# Patient Record
Sex: Male | Born: 2000
Health system: Southern US, Community
[De-identification: ages and names within clinical notes are randomized; demographics above are authoritative.]

## PROBLEM LIST (undated history)

## (undated) DIAGNOSIS — H9190 Unspecified hearing loss, unspecified ear: Secondary | ICD-10-CM

## (undated) DIAGNOSIS — F819 Developmental disorder of scholastic skills, unspecified: Secondary | ICD-10-CM

## (undated) DIAGNOSIS — M419 Scoliosis, unspecified: Secondary | ICD-10-CM

## (undated) DIAGNOSIS — M539 Dorsopathy, unspecified: Secondary | ICD-10-CM

## (undated) DIAGNOSIS — Q740 Other congenital malformations of upper limb(s), including shoulder girdle: Secondary | ICD-10-CM

## (undated) DIAGNOSIS — G4730 Sleep apnea, unspecified: Secondary | ICD-10-CM

## (undated) DIAGNOSIS — J189 Pneumonia, unspecified organism: Secondary | ICD-10-CM

## (undated) HISTORY — PX: TYMPANOSTOMY TUBE PLACEMENT: SHX32

## (undated) HISTORY — PX: DENTAL SURGERY: SHX609

## (undated) HISTORY — PX: HERNIA REPAIR: SHX51

## (undated) HISTORY — PX: CIRCUMCISION: SUR203

## (undated) HISTORY — PX: TONSILLECTOMY AND ADENOIDECTOMY: SUR1326

## (undated) HISTORY — PX: BACK SURGERY: SHX140

---
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
## 2021-01-09 DIAGNOSIS — Z981 Arthrodesis status: Secondary | ICD-10-CM | POA: Diagnosis not present

## 2021-01-09 DIAGNOSIS — M419 Scoliosis, unspecified: Secondary | ICD-10-CM | POA: Diagnosis not present

## 2021-01-15 DIAGNOSIS — Z23 Encounter for immunization: Secondary | ICD-10-CM | POA: Insufficient documentation

## 2021-01-15 DIAGNOSIS — Z7689 Persons encountering health services in other specified circumstances: Secondary | ICD-10-CM | POA: Insufficient documentation

## 2021-01-16 ENCOUNTER — Other Ambulatory Visit: Payer: Self-pay

## 2021-01-16 ENCOUNTER — Ambulatory Visit (INDEPENDENT_AMBULATORY_CARE_PROVIDER_SITE_OTHER): Payer: BC Managed Care – PPO | Admitting: Nurse Practitioner

## 2021-01-16 ENCOUNTER — Encounter (HOSPITAL_BASED_OUTPATIENT_CLINIC_OR_DEPARTMENT_OTHER): Payer: Self-pay | Admitting: Nurse Practitioner

## 2021-01-16 VITALS — BP 101/77 | HR 68 | Ht <= 58 in | Wt 97.4 lb

## 2021-01-16 DIAGNOSIS — R6252 Short stature (child): Secondary | ICD-10-CM | POA: Insufficient documentation

## 2021-01-16 DIAGNOSIS — Q740 Other congenital malformations of upper limb(s), including shoulder girdle: Secondary | ICD-10-CM

## 2021-01-16 DIAGNOSIS — Q763 Congenital scoliosis due to congenital bony malformation: Secondary | ICD-10-CM

## 2021-01-16 DIAGNOSIS — H9190 Unspecified hearing loss, unspecified ear: Secondary | ICD-10-CM | POA: Insufficient documentation

## 2021-01-16 DIAGNOSIS — N3944 Nocturnal enuresis: Secondary | ICD-10-CM | POA: Insufficient documentation

## 2021-01-16 DIAGNOSIS — G919 Hydrocephalus, unspecified: Secondary | ICD-10-CM | POA: Insufficient documentation

## 2021-01-16 DIAGNOSIS — Z23 Encounter for immunization: Secondary | ICD-10-CM

## 2021-01-16 DIAGNOSIS — F639 Impulse disorder, unspecified: Secondary | ICD-10-CM

## 2021-01-16 DIAGNOSIS — Z713 Dietary counseling and surveillance: Secondary | ICD-10-CM

## 2021-01-16 DIAGNOSIS — H903 Sensorineural hearing loss, bilateral: Secondary | ICD-10-CM | POA: Diagnosis not present

## 2021-01-16 DIAGNOSIS — Z7689 Persons encountering health services in other specified circumstances: Secondary | ICD-10-CM

## 2021-01-16 DIAGNOSIS — K029 Dental caries, unspecified: Secondary | ICD-10-CM | POA: Insufficient documentation

## 2021-01-16 DIAGNOSIS — IMO0002 Reserved for concepts with insufficient information to code with codable children: Secondary | ICD-10-CM | POA: Insufficient documentation

## 2021-01-16 DIAGNOSIS — F819 Developmental disorder of scholastic skills, unspecified: Secondary | ICD-10-CM

## 2021-01-16 DIAGNOSIS — R4689 Other symptoms and signs involving appearance and behavior: Secondary | ICD-10-CM | POA: Insufficient documentation

## 2021-01-16 DIAGNOSIS — R479 Unspecified speech disturbances: Secondary | ICD-10-CM | POA: Insufficient documentation

## 2021-01-16 HISTORY — DX: Short stature (child): R62.52

## 2021-01-16 HISTORY — DX: Unspecified hearing loss, unspecified ear: H91.90

## 2021-01-16 NOTE — Assessment & Plan Note (Signed)
Discussion on appropriate dietary measures including: No more than one cup of coffee per day- without refills No more than one cup of soda per day- without refills Avoid high sugar foods and replace with fruits and vegetables Importance of healthy food options for growth and healing.

## 2021-01-16 NOTE — Assessment & Plan Note (Signed)
Review of current and past medical history, social history, medication, and family history.  Review of care gaps and health maintenance recommendations.  Records from recent providers to be requested if not available in Chart Review or Care Everywhere.  Recommendations for health maintenance, diet, and exercise provided.   

## 2021-01-16 NOTE — Patient Instructions (Addendum)
Recommendations from today's visit: We will call you when the vaccine is in and you can come in at your convenience for a nurse visit to get this vaccine.   Information on diet, exercise, and health maintenance recommendations are listed below. This is information to help you be sure you are on track for optimal health and monitoring.   Please look over this and let us know if you have any questions or if you have completed any of the health maintenance outside of [facility] so that we can be sure your records are up to date.  ___________________________________________________________  Thank you for choosing [facility] for your Primary Care needs. I am excited for the opportunity to partner with you to meet your health care goals. It was a pleasure meeting you today!  I am an Adult-Geriatric Nurse Practitioner with a background in caring for patients for more than 20 years. I received my Paediatric nurse in Nursing and my Doctor of Nursing Practice degrees at [employer]. I received additional fellowship training in primary care and sports medicine after receiving my doctorate degree. I provide primary care and sports medicine services to patients age 28 and older within this office. I am also a provider with the [facility] and the director of the APP Fellowship with [facility].  I am a [place] native, but have called the [place] area home for nearly 20 years and am proud to be a member of this community.   I am passionate about providing the best service to you through preventive medicine and supportive care. I consider you a part of the medical team and value your input. I work diligently to ensure that you are heard and your needs are met in a safe and effective manner. I want you to feel comfortable with me as your provider and want you to know that your health concerns are important to me.   For your  information, our office hours are Monday- Friday 8:00 AM - 5:00 PM At this time I am not in the office on Wednesdays.  If you have questions or concerns, please call our office at [phone] or send [place] a MyChart message and we will respond as quickly as possible.   For all urgent or time sensitive needs we ask that you please call the office to avoid delays. MyChart is not constantly monitored and replies may take up to 72 business hours.  MyChart Policy: MyChart allows for you to see your visit notes, after visit summary, provider recommendations, lab and tests results, make an appointment, request refills, and contact your provider or the office for non-urgent questions or concerns.  Providers are seeing patients during normal business hours and do not have built in time to review MyChart messages. We ask that you allow a minimum of 72 business hours for MyChart message responses.  Complex MyChart concerns may require a visit. Your provider may request you schedule a virtual or in person visit to ensure we are providing the best care possible. MyChart messages sent after 4:00 PM on Friday will not be received by the provider until Monday morning.    Lab and Test Results: You will receive your lab and test results on MyChart as soon as they are completed and results have been sent by the lab or testing facility. Due to this service, you will receive your results BEFORE your provider.  Please allow a minimum of 72 business  hours for your provider to receive and review lab and test results and contact you about.   Most lab and test result comments from the provider will be sent through [place]. Your provider may recommend changes to the plan of care, follow-up visits, repeat testing, ask questions, or request an office visit to discuss these results. You may reply directly to this message or call the office at [phone] to provide information for the provider or set up an appointment. In some  instances, you will be called with test results and recommendations. Please let us know if this is preferred and we will make note of this in your chart to provide this for you.    If you have not heard a response to your lab or test results in 72 business hours, please call the office to let us know.   After Hours: For all non-emergency after hours needs, please call the office at [phone] and select the option to reach the on-call provider service. On-call services are shared between multiple [facility] offices and therefore it will not be possible to speak directly with your provider. On-call providers may provide medical advice and recommendations, but are unable to provide refills for maintenance medications.  For all emergency or urgent medical needs after normal business hours, we recommend that you seek care at the closest Urgent Care or Emergency Department to ensure appropriate treatment in a timely manner.  [facility] has a 24 hour emergency room located on the ground floor for your convenience.    Please do not hesitate to reach out to [place] with concerns.   Thank you, again, for choosing me as your health care partner. I appreciate your trust and look forward to learning more about you.   Worthy Keeler, DNP, AGNP-c ___________________________________________________________  Health Maintenance Recommendations Screening Testing Mammogram Every 1 -2 years based on history and risk factors Starting at age 72 Pap Smear Ages 21-39 every 3 years Ages 81-65 every 5 years with HPV testing More frequent testing may be required based on results and history Colon Cancer Screening Every 1-10 years based on test performed, risk factors, and history Starting at age 81 Bone Density Screening Every 2-10 years based on history Starting at age 64 for women Recommendations for men differ based on medication usage, history, and risk factors AAA Screening One time  ultrasound Men 43-26 years old who have every smoked Lung Cancer Screening Low Dose Lung CT every 12 months Age 62-80 years with a 30 pack-year smoking history who still smoke or who have quit within the last 15 years  Screening Labs Routine  Labs: Complete Blood Count (CBC), Complete Metabolic Panel (CMP), Cholesterol (Lipid Panel) Every 6-12 months based on history and medications May be recommended more frequently based on current conditions or previous results Hemoglobin A1c Lab Every 3-12 months based on history and previous results Starting at age 45 or earlier with diagnosis of diabetes, high cholesterol, BMI >26, and/or risk factors Frequent monitoring for patients with diabetes to ensure blood sugar control Thyroid Panel (TSH w/ T3 & T4) Every 6 months based on history, symptoms, and risk factors May be repeated more often if on medication HIV One time testing for all patients 64 and older May be repeated more frequently for patients with increased risk factors or exposure Hepatitis C One time testing for all patients 74 and older May be repeated more frequently for patients with increased risk factors or exposure Gonorrhea, Chlamydia Every 12 months  for all sexually active persons 13-24 years Additional monitoring may be recommended for those who are considered high risk or who have symptoms PSA Men 35-77 years old with risk factors Additional screening may be recommended from age 55-69 based on risk factors, symptoms, and history  Vaccine Recommendations Tetanus Booster All adults every 10 years Flu Vaccine All patients 6 months and older every year COVID Vaccine All patients 12 years and older Initial dosing with booster May recommend additional booster based on age and health history HPV Vaccine 2 doses all patients age 37-26 Dosing may be considered for patients over 26 Shingles Vaccine (Shingrix) 2 doses all adults 12 years and older Pneumonia (Pneumovax  23) All adults 45 years and older May recommend earlier dosing based on health history Pneumonia (Prevnar 20) All adults 33 years and older Dosed 1 year after Pneumovax 23  Additional Screening, Testing, and Vaccinations may be recommended on an individualized basis based on family history, health history, risk factors, and/or exposure.  __________________________________________________________  Diet Recommendations for All Patients  I recommend that all patients maintain a diet low in saturated fats, carbohydrates, and cholesterol. While this can be challenging at first, it is not impossible and small changes can make big differences.  Things to try: Decreasing the amount of soda, sweet tea, and/or juice to one or less per day and replace with water While water is always the first choice, if you do not like water you may consider adding a water additive without sugar to improve the taste other sugar free drinks Replace potatoes with a brightly colored vegetable at dinner Use healthy oils, such as canola oil or olive oil, instead of butter or hard margarine Limit your bread intake to two pieces or less a day Replace regular pasta with low carb pasta options Bake, broil, or grill foods instead of frying Monitor portion sizes  Eat smaller, more frequent meals throughout the day instead of large meals  An important thing to remember is, if you love foods that are not great for your health, you don't have to give them up completely. Instead, allow these foods to be a reward when you have done well. Allowing yourself to still have special treats every once in a while is a nice way to tell yourself thank you for working hard to keep yourself healthy.   Also remember that every day is a new day. If you have a bad day and "fall off the wagon", you can still climb right back up and keep moving along on your journey!  We have resources available to help you!  Some websites that may be helpful  include: www.http://carter.biz/  Www.VeryWellFit.com _____________________________________________________________  Activity Recommendations for All Patients  I recommend that all adults get at least 20 minutes of moderate physical activity that elevates your heart rate at least 5 days out of the week.  Some examples include: Walking or jogging at a pace that allows you to carry on a conversation Cycling (stationary bike or outdoors) Water aerobics Yoga Weight lifting Dancing If physical limitations prevent you from putting stress on your joints, exercise in a pool or seated in a chair are excellent options.  Do determine your MAXIMUM heart rate for activity: YOUR AGE - 220 = MAX HeartRate   Remember! Do not push yourself too hard.  Start slowly and build up your pace, speed, weight, time in exercise, etc.  Allow your body to rest between exercise and get good sleep. You will need more water  than normal when you are exerting yourself. Do not wait until you are thirsty to drink. Drink with a purpose of getting in at least 8, 8 ounce glasses of water a day plus more depending on how much you exercise and sweat.    If you begin to develop dizziness, chest pain, abdominal pain, jaw pain, shortness of breath, headache, vision changes, lightheadedness, or other concerning symptoms, stop the activity and allow your body to rest. If your symptoms are severe, seek emergency evaluation immediately. If your symptoms are concerning, but not severe, please let us know so that we can recommend further evaluation.   ________________________________________________________________

## 2021-01-16 NOTE — Assessment & Plan Note (Signed)
Bilateral hearing loss with hearing aid use Patient able to verbalize needs and answer questions today Recommend monitoring for impaction of cerumen given chronic hearing aid use Removal may be est considered at a time when he is under sedation for alternate procedure or with the use of light sedative medication.

## 2021-01-16 NOTE — Assessment & Plan Note (Signed)
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  Multiple hx of surgical interventions.  No complex concerns at this time.  Recommend: Reinforce safety and nutrition Monitor for dental carries and tooth decay Monitor for infection and pain- patient reportedly unaware of pain

## 2021-01-16 NOTE — Assessment & Plan Note (Signed)
Indications of impulse control identified on exam and by maternal report. Given developmental delay, this may be challenging to correct, however, given is respectability, I feel that counseling services may benefit him with reinforcement of appropriate reaction and response as well as thinking actions through prior to engagement.  Witnessed mother discussing reactions and patients response, which was excellent.  Reinforcement from outside sources would like be of benefit. Consider formal counseling, specifically if counseling services for developmental delay can be established.

## 2021-01-16 NOTE — Assessment & Plan Note (Signed)
Developmental delay related to congential condition.   Recommend: Monitor for safety and understanding Assistance with high intellectual thought processes and skills Monitor for new or worsening s/s that may indicate possible infection or neurological concern

## 2021-01-16 NOTE — Progress Notes (Signed)
Edwin Clamp, DNP, AGNP-c Primary Care Services ______________________________________________________________________  HPI Edwin Price is a 20 y.o. male presenting to [facility] today to establish care. He presents with his mother, Edwin Price, who is the patients representative due to developmental delay.   Patient Care Team: Jahon Bart, Sung Amabile, NP as PCP - General (Nurse Practitioner)  Health Maintenance  Topic Date Due   HPV Vaccine (1 - Male 2-dose series) Never done   HIV Screening  Never done   Hepatitis C Screening: USPSTF Recommendation to screen - Ages 48-79 yo.  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  He has undergone multiple surgeries throughout his lifetime to help with the skeletal defects associated with this condition. His most recent surgery was on his spine in November of last year. He still has a healing wound from the surgery present. Mother reports that the surgery prior to this one took 6 months to fully heal. He is not in pain and does not complain about the site.  Mother endorses concern that Edwin Price does not typically appear to feel or react to pain. She reports that she feels this leads to less than safe behaviors. She endorses that after a spinal surgery he was immediately active and anxious to leave the hospital. She tells me that his insistence on returning to baseline functionality eventually led to him losing consciousness three days after surgery due to too much activity. She states during the event he never endorsed pain.   Edwin Price is one of six children in the family, he is the 4th child born. Mother and father are no longer together, but share  custody of the children.   Meningococcal vaccine Letter received stating patient is due for MCV as a second dose from 50/19 year old vaccine.  Patient was previously seen by Dr. Excell Seltzer with [facility] and was up to date on all vaccines until discharged due to age.  Mother unaware that a vaccine was due.  Diet Mother endorses concerns for patients dietary habits and obsessive type behaviors with coffee, sugar, and soda.  She reports he does enjoy healthy foods and will eat healthily, but she is concerned about his lack of understanding boundaries when it comes to eating.  She requests reinforcement on healthy dietary habits.  Obsessive Behaviors Mother reports that Edwin Price has developed obsessive like behaviors, that mimic addiction, for coffee, soda, and sweets.  Mother states recently she saw Edwin Price riding his bicycle up [address] in heavy traffic as she was on her way home. She tells me that when she caught up to him and stopped him he told her he was going to [employer] to get a coffee. She reports he ignored the dangers of the action and was angry when she stopped him.  She also reports more than one incident when he has stolen soda from stores and reports that she cannot allow him to go into places that sell soda and sweets alone as he will take them and hide them.  She reports that at home he has started to make coffee and will repeatedly fill the cup before it is empty, drinking several pots of coffee in a day if permitted.  Mother  endorses concerns over the compulsive and obsessive interest in coffee, soda, and sweets and his lack of comprehension that this is not healthy or good for him despite repeatedly telling him.. Mother endorses concern that if he is not being constantly monitored he will over indulge.  Mother, who is a Veterinary surgeon, feels that he would benefit from counseling services and reiteration of information on health and safety that have been presented to him at  home.    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 GAD7 Today: No flowsheet data found. ______________________________________________________________________ PMH Past Medical History:  Diagnosis Date   Arachnoid cyst 05/27/2015   Asthma    as a child   Back disorder    Child with short stature 01/16/2021   Cleidocranial dysplasia    Difficulty hearing 01/16/2021   Hearing deficit    Hearing loss 01/16/2021   Intellectual delay    Mixed conductive and sensorineural hearing loss of both ears 08/03/2018   Obstructive sleep apnea of child 04/28/2013   Pneumonia    Scoliosis    Sleep apnea    will not use a cpap   Syndromic scoliosis 10/12/2017    ROS All review of systems negative except what is listed in the HPI  PHYSICAL EXAM Physical Exam Vitals and nursing note reviewed.  HENT:     Head:     Comments: Craniofacial structural deformities present consistent with congenital cleidocranial dysostosis.     Right Ear: Ear canal and external ear normal. There is impacted cerumen.     Left Ear:  Ear canal and external ear normal. There is impacted cerumen.     Ears:     Comments: Hearing loss with chronic hearing aid use.     Mouth/Throat:     Mouth: Mucous membranes are moist.     Dentition: Abnormal dentition.     Comments: Dental abnormalities consistent with congenital condition. Unable to visualize posterior oropharynx.  Eyes:     General: Lids are normal. Vision grossly intact.     Pupils: Pupils are equal, round, and reactive to light.     Comments: Malalignment of gaze, chronic. Consistent with congenital condition. Visual fields appear intact.   Cardiovascular:     Rate and Rhythm: Normal rate and regular rhythm. No extrasystoles are present.    Chest Wall: PMI is not displaced.     Pulses: Normal pulses.     Heart sounds: Normal heart sounds, S1 normal and S2 normal.  Pulmonary:     Effort: Pulmonary effort is normal.     Breath sounds: Normal breath sounds and air entry.  Abdominal:     General: Abdomen is flat. Bowel sounds are normal. There is  no distension or abdominal bruit. There are no signs of injury.     Palpations: Abdomen is soft.     Tenderness: There is no abdominal tenderness.  Musculoskeletal:     Cervical back: Erythema present. No pain with movement, spinous process tenderness or muscular tenderness. Decreased range of motion.     Thoracic back: Deformity present. No bony tenderness. Decreased range of motion. Scoliosis present.     Lumbar back: No tenderness. Normal range of motion.     Right lower leg: No edema.     Left lower leg: No edema.     Comments: Appx 3cm surgical wound on right posterior thoracic spine superior to the scapula with well healed surgical incision surrounding. Wound clean with no signs of drainage, odor, or erythema. Appears to be healing, but likely in slow formation.   Significant changes of spine consistent with scoliosis and bony abnormality.   Feet:     Right foot:     Skin integrity: Skin integrity normal.     Left  foot:     Skin integrity: Skin integrity normal.  Lymphadenopathy:     Cervical: No cervical adenopathy.  Skin:    General: Skin is warm and dry.     Capillary Refill: Capillary refill takes less than 2 seconds.     Findings: Wound present.     Nails: There is clubbing.  Neurological:     Mental Status: He is alert. Mental status is at baseline.     Comments: Deferment of complete examination on cranial nerves due to patient tiring of examination and limitations due to developmental delay. All witnessed interactions show appropriate movement with bilateral involvement.  Psychiatric:        Attention and Perception: He is inattentive.        Mood and Affect: Affect is labile.        Speech: Speech is delayed and slurred.        Behavior: Behavior is agitated and hyperactive.        Cognition and Memory: Memory normal. Cognition is impaired.        Judgment: Judgment is impulsive.     Comments: Psychiatric evaluation consistent with developmental delay. Thought processes and reactions consistent with younger than expected age and more in line with pre-adolescent behaviors.  Impulsive reactions and responses witnessed, consistent with delay described above.  Redirectable to an extent and mostly cooperative.  Able to verbally disclose displeasure and frustration, but not in appropriate manner for age.    ______________________________________________________________________ ASSESSMENT AND PLAN Problem List Items Addressed This Visit     Cleidocranial dysplasia (Chronic)    Congenital disorder with significant impact on physical, social, and intellectual development.  Multiple hx of surgical interventions.  No complex concerns at this time.  Recommend: Reinforce safety and nutrition Monitor for dental carries and tooth decay Monitor for infection and pain- patient reportedly unaware of pain        Congenital scoliosis due to congenital bony malformation (Chronic)    Significant  curvature of spine with multiple surgical interventions Risk of lung infection increased due to hypoventilation Monitor for concerning symptoms       Sensorineural hearing loss (SNHL) of both ears (Chronic)    Bilateral hearing loss with hearing aid use Patient able to verbalize needs and answer questions today Recommend monitoring for impaction of cerumen given chronic hearing aid use Removal may be est considered at a time when he is under sedation for alternate procedure or with  the use of light sedative medication.        Cognitive developmental delay    Developmental delay related to congential condition.   Recommend: Monitor for safety and understanding Assistance with high intellectual thought processes and skills Monitor for new or worsening s/s that may indicate possible infection or neurological concern       Establishing care with new doctor, encounter for - Primary    Review of current and past medical history, social history, medication, and family history.  Review of care gaps and health maintenance recommendations.  Records from recent providers to be requested if not available in Chart Review or Care Everywhere.  Recommendations for health maintenance, diet, and exercise provided.         Relevant Orders   CBC with Differential   Comprehensive metabolic panel   Need for meningococcal vaccination    Unable to access [place] database today for unknown reason.  It appears that patient is likely due for 16 year booster of menactra, but would like to check database to ensure proper immunization is given.  Office out of [place] today, therefore, will order appropriate vaccines and recommend that patient return next week once we can determine the appropriate vaccination needed.        Disorder of impulse control    Indications of impulse control identified on exam and by maternal report. Given developmental delay, this may be challenging to correct, however, given is  respectability, I feel that counseling services may benefit him with reinforcement of appropriate reaction and response as well as thinking actions through prior to engagement.  Witnessed mother discussing reactions and patients response, which was excellent.  Reinforcement from outside sources would like be of benefit. Consider formal counseling, specifically if counseling services for developmental delay can be established.        Dietary counseling    Discussion on appropriate dietary measures including: No more than one cup of coffee per day- without refills No more than one cup of soda per day- without refills Avoid high sugar foods and replace with fruits and vegetables Importance of healthy food options for growth and healing.        Education provided today during visit and on AVS for patient to review at home.  Diet and Exercise recommendations provided.  Current diagnoses and recommendations discussed. HM recommendations reviewed with recommendations.    Outpatient Encounter Medications as of 01/16/2021  Medication Sig   vitamin C (ASCORBIC ACID) 500 MG tablet Take 500 mg by mouth daily.   zinc gluconate 50 MG tablet Take 50 mg by mouth daily.   No facility-administered encounter medications on file as of 01/16/2021.    Return in about 1 year (around 01/16/2022) for Next week for mengococcal vaccine. CPE with labs.  Time: 70 minutes, >50% spent counseling, care coordination, chart review, and documentation.   Tollie Eth, DNP, AGNP-c

## 2021-01-16 NOTE — Assessment & Plan Note (Signed)
Significant curvature of spine with multiple surgical interventions Risk of lung infection increased due to hypoventilation Monitor for concerning symptoms

## 2021-01-16 NOTE — Assessment & Plan Note (Signed)
Unable to access [place] database today for unknown reason.  It appears that patient is likely due for 16 year booster of menactra, but would like to check database to ensure proper immunization is given.  Office out of [employer] today, therefore, will order appropriate vaccines and recommend that patient return next week once we can determine the appropriate vaccination needed.

## 2021-01-17 LAB — CBC WITH DIFFERENTIAL/PLATELET
Basophils Absolute: 0.1 10*3/uL (ref 0.0–0.2)
Basos: 1 %
EOS (ABSOLUTE): 0.1 10*3/uL (ref 0.0–0.4)
Eos: 2 %
Hematocrit: 46 % (ref 37.5–51.0)
Hemoglobin: 14.8 g/dL (ref 13.0–17.7)
Immature Grans (Abs): 0 10*3/uL (ref 0.0–0.1)
Immature Granulocytes: 0 %
Lymphocytes Absolute: 2.2 10*3/uL (ref 0.7–3.1)
Lymphs: 38 %
MCH: 28.3 pg (ref 26.6–33.0)
MCHC: 32.2 g/dL (ref 31.5–35.7)
MCV: 88 fL (ref 79–97)
Monocytes Absolute: 0.5 10*3/uL (ref 0.1–0.9)
Monocytes: 8 %
Neutrophils Absolute: 2.9 10*3/uL (ref 1.4–7.0)
Neutrophils: 51 %
Platelets: 312 10*3/uL (ref 150–450)
RBC: 5.23 x10E6/uL (ref 4.14–5.80)
RDW: 12 % (ref 11.6–15.4)
WBC: 5.7 10*3/uL (ref 3.4–10.8)

## 2021-01-17 LAB — COMPREHENSIVE METABOLIC PANEL
ALT: 13 IU/L (ref 0–44)
AST: 25 IU/L (ref 0–40)
Albumin/Globulin Ratio: 2.9 — ABNORMAL HIGH (ref 1.2–2.2)
Albumin: 5.2 g/dL (ref 4.1–5.2)
Alkaline Phosphatase: 212 IU/L — ABNORMAL HIGH (ref 51–125)
BUN/Creatinine Ratio: 16 (ref 9–20)
BUN: 9 mg/dL (ref 6–20)
Bilirubin Total: 0.4 mg/dL (ref 0.0–1.2)
CO2: 22 mmol/L (ref 20–29)
Calcium: 9.5 mg/dL (ref 8.7–10.2)
Chloride: 102 mmol/L (ref 96–106)
Creatinine, Ser: 0.57 mg/dL — ABNORMAL LOW (ref 0.76–1.27)
Globulin, Total: 1.8 g/dL (ref 1.5–4.5)
Glucose: 87 mg/dL (ref 65–99)
Potassium: 4 mmol/L (ref 3.5–5.2)
Sodium: 138 mmol/L (ref 134–144)
Total Protein: 7 g/dL (ref 6.0–8.5)
eGFR: 145 mL/min/{1.73_m2} (ref >59)

## 2021-01-27 ENCOUNTER — Telehealth (HOSPITAL_BASED_OUTPATIENT_CLINIC_OR_DEPARTMENT_OTHER): Payer: Self-pay

## 2021-01-27 NOTE — Telephone Encounter (Signed)
Results released by [name], AGNP and called patient's mother Izeah Vossler to discuss lab results and recommendations.  She is aware and understands.  She scheduled a nurse visit on 02/11/21 @ 9:00 am for lab draw.  Instructed her to contact the office with any questions or concerns.

## 2021-01-27 NOTE — Telephone Encounter (Signed)
-----   Message from Tollie Eth, NP sent at 01/27/2021  8:01 AM EDT ----- Please call patients Mother:  Blood counts look good. No signs of infection or anemia.   There is a bump in one of the liver function studies. I would like to repeat this in 1 week to see if this was just a fluke or something we need to monitor.   Please schedule nurse visit for labs in 1-2 weeks for LFT - dx: elevated liver enzymes, and Thyroid panel with reflex T3/T4. Recommend these are fasting labs.

## 2021-01-27 NOTE — Progress Notes (Signed)
Please call patients Mother:  Blood counts look good. No signs of infection or anemia.   There is a bump in one of the liver function studies. I would like to repeat this in 1 week to see if this was just a fluke or something we need to monitor.   Please schedule nurse visit for labs in 1-2 weeks for LFT - dx: elevated liver enzymes, and Thyroid panel with reflex T3/T4. Recommend these are fasting labs.

## 2021-02-11 ENCOUNTER — Other Ambulatory Visit: Payer: Self-pay

## 2021-02-11 ENCOUNTER — Ambulatory Visit (INDEPENDENT_AMBULATORY_CARE_PROVIDER_SITE_OTHER): Payer: BC Managed Care – PPO | Admitting: Nurse Practitioner

## 2021-02-11 ENCOUNTER — Telehealth (HOSPITAL_BASED_OUTPATIENT_CLINIC_OR_DEPARTMENT_OTHER): Payer: Self-pay

## 2021-02-11 VITALS — BP 100/63 | HR 67 | Ht 60.0 in

## 2021-02-11 DIAGNOSIS — E349 Endocrine disorder, unspecified: Secondary | ICD-10-CM | POA: Diagnosis not present

## 2021-02-11 DIAGNOSIS — R748 Abnormal levels of other serum enzymes: Secondary | ICD-10-CM

## 2021-02-11 DIAGNOSIS — Z23 Encounter for immunization: Secondary | ICD-10-CM

## 2021-02-12 LAB — HEPATIC FUNCTION PANEL
ALT: 11 IU/L (ref 0–44)
AST: 16 IU/L (ref 0–40)
Albumin: 4.9 g/dL (ref 4.1–5.2)
Alkaline Phosphatase: 210 IU/L — ABNORMAL HIGH (ref 51–125)
Bilirubin Total: 0.5 mg/dL (ref 0.0–1.2)
Bilirubin, Direct: 0.14 mg/dL (ref 0.00–0.40)
Total Protein: 6.9 g/dL (ref 6.0–8.5)

## 2021-02-12 LAB — THYROID PANEL WITH TSH
Free Thyroxine Index: 2.3 (ref 1.2–4.9)
T3 Uptake Ratio: 30 % (ref 24–39)
T4, Total: 7.7 ug/dL (ref 4.5–12.0)
TSH: 1.44 u[IU]/mL (ref 0.450–4.500)

## 2021-02-13 NOTE — Telephone Encounter (Signed)
Meningococcal (Groups A, C, Y and [id number]) vaccination given.

## 2021-02-14 ENCOUNTER — Telehealth (HOSPITAL_BASED_OUTPATIENT_CLINIC_OR_DEPARTMENT_OTHER): Payer: Self-pay

## 2021-02-14 DIAGNOSIS — Q740 Other congenital malformations of upper limb(s), including shoulder girdle: Secondary | ICD-10-CM

## 2021-02-14 NOTE — Telephone Encounter (Signed)
Per DPR spoke with patients mother She is aware and agreeable to lab results and recommendations She is agreeable to referral. Orders placed

## 2021-02-14 NOTE — Telephone Encounter (Signed)
-----   Message from Tollie Eth, NP sent at 02/14/2021  8:17 AM EDT ----- Please call patients mother:  Thyroid panel normal, however, phosphate levels are still elevated. Would like to send for endocrinology evaluation given diagnosis of cleidocranial dysostosis to aid in management and recommendations considering this appears to be a new finding.   If mom is Ok with this referral, please place for dx of hyperphosphatemia and cleidocranial dysostosis.

## 2021-02-14 NOTE — Progress Notes (Signed)
Please call patients mother:  Thyroid panel normal, however, phosphate levels are still elevated. Would like to send for endocrinology evaluation given diagnosis of cleidocranial dysostosis to aid in management and recommendations considering this appears to be a new finding.   If mom is Ok with this referral, please place for dx of hyperphosphatemia and cleidocranial dysostosis.

## 2021-04-16 ENCOUNTER — Other Ambulatory Visit (HOSPITAL_BASED_OUTPATIENT_CLINIC_OR_DEPARTMENT_OTHER): Payer: Self-pay | Admitting: Nurse Practitioner

## 2021-04-16 ENCOUNTER — Encounter (HOSPITAL_BASED_OUTPATIENT_CLINIC_OR_DEPARTMENT_OTHER): Payer: Self-pay | Admitting: Nurse Practitioner

## 2021-04-28 ENCOUNTER — Ambulatory Visit: Payer: BC Managed Care – PPO | Admitting: Endocrinology

## 2021-05-01 DIAGNOSIS — Q675 Congenital deformity of spine: Secondary | ICD-10-CM | POA: Diagnosis not present

## 2021-05-01 DIAGNOSIS — M4185 Other forms of scoliosis, thoracolumbar region: Secondary | ICD-10-CM | POA: Diagnosis not present

## 2021-05-01 DIAGNOSIS — Q740 Other congenital malformations of upper limb(s), including shoulder girdle: Secondary | ICD-10-CM | POA: Diagnosis not present

## 2021-05-01 DIAGNOSIS — M419 Scoliosis, unspecified: Secondary | ICD-10-CM | POA: Diagnosis not present

## 2021-05-21 ENCOUNTER — Encounter: Payer: Self-pay | Admitting: Endocrinology

## 2021-05-21 ENCOUNTER — Ambulatory Visit (INDEPENDENT_AMBULATORY_CARE_PROVIDER_SITE_OTHER): Payer: BC Managed Care – PPO | Admitting: Endocrinology

## 2021-05-21 ENCOUNTER — Other Ambulatory Visit: Payer: Self-pay

## 2021-05-21 VITALS — BP 118/80 | HR 63 | Ht 60.0 in | Wt 99.4 lb

## 2021-05-21 DIAGNOSIS — Q740 Other congenital malformations of upper limb(s), including shoulder girdle: Secondary | ICD-10-CM | POA: Diagnosis not present

## 2021-05-21 LAB — VITAMIN D 25 HYDROXY (VIT D DEFICIENCY, FRACTURES): VITD: 34.89 ng/mL (ref 30.00–100.00)

## 2021-05-21 LAB — PHOSPHORUS: Phosphorus: 3.3 mg/dL — ABNORMAL LOW (ref 4.5–5.5)

## 2021-05-21 LAB — URINALYSIS, ROUTINE W REFLEX MICROSCOPIC
Bilirubin Urine: NEGATIVE
Hgb urine dipstick: NEGATIVE
Ketones, ur: NEGATIVE
Leukocytes,Ua: NEGATIVE
Nitrite: NEGATIVE
RBC / HPF: NONE SEEN (ref 0–?)
Specific Gravity, Urine: 1.02 (ref 1.000–1.030)
Total Protein, Urine: NEGATIVE
Urine Glucose: NEGATIVE
Urobilinogen, UA: 0.2 (ref 0.0–1.0)
pH: 6 (ref 5.0–8.0)

## 2021-05-21 LAB — MAGNESIUM: Magnesium: 2 mg/dL (ref 1.5–2.5)

## 2021-05-21 LAB — BASIC METABOLIC PANEL
BUN: 11 mg/dL (ref 6–23)
CO2: 26 mEq/L (ref 19–32)
Calcium: 9.4 mg/dL (ref 8.4–10.5)
Chloride: 103 mEq/L (ref 96–112)
Creatinine, Ser: 0.54 mg/dL (ref 0.40–1.50)
GFR: 144.2 mL/min (ref >60.00)
Glucose, Bld: 77 mg/dL (ref 70–99)
Potassium: 3.7 mEq/L (ref 3.5–5.1)
Sodium: 139 mEq/L (ref 135–145)

## 2021-05-21 LAB — CORTISOL: Cortisol, Plasma: 9.9 ug/dL

## 2021-05-21 NOTE — Patient Instructions (Signed)
Blood tests are requested for you today.  We'll let you know about the results.  

## 2021-05-21 NOTE — Progress Notes (Signed)
Subjective:    Patient ID: Edwin Price, male    DOB: February 19, 2001, 20 y.o.   MRN: [id number]  HPI Pt is ref by Enid Skeens, NP.  Mother provides hx, due to developmental delay.  Short stature was found at birth.  Cleidocranial dysplasia is also manifest be developmental delay and hearing loss, and failure to develop adult teeth.  He reads at first grade level.  He was last seen by ped endocrinologist at age 42.   Past Medical History:  Diagnosis Date   Arachnoid cyst 05/27/2015   Asthma    as a child   Back disorder    Child with short stature 01/16/2021   Cleidocranial dysplasia    Difficulty hearing 01/16/2021   Hearing loss 01/16/2021   Intellectual delay    Mixed conductive and sensorineural hearing loss of both ears 08/03/2018   Obstructive sleep apnea of child 04/28/2013   Pneumonia    Scoliosis    Sleep apnea    will not use a cpap   Syndromic scoliosis 10/12/2017    Past Surgical History:  Procedure Laterality Date   BACK SURGERY     MULTIPLE   CIRCUMCISION     HERNIA REPAIR     MYRINGOTOMY WITH TUBE PLACEMENT Bilateral 08/08/2018   Procedure: MYRINGOTOMY WITH BILATERAL TUBE PLACEMENT;  Surgeon: Serena Colonel, MD;  Location: [facility] OR;  Service: ENT;  Laterality: Bilateral;   TONSILLECTOMY AND ADENOIDECTOMY     TYMPANOSTOMY TUBE PLACEMENT      Social History   Socioeconomic History   Marital status: Single    Spouse name: Not on file   Number of children: Not on file   Years of education: Not on file   Highest education level: Not on file  Occupational History   Not on file  Tobacco Use   Smoking status: Never   Smokeless tobacco: Never  Substance and Sexual Activity   Alcohol use: Not on file   Drug use: Not on file   Sexual activity: Not on file  Other Topics Concern   Not on file  Social History Narrative   [name] is a Kindergarten/1st grade home schooled student. He lives with his parents and siblings. He enjoys playing outside, digging, and watching  YouTube Videos.    Social Determinants of Health   Financial Resource Strain: Not on file  Food Insecurity: Not on file  Transportation Needs: Not on file  Physical Activity: Not on file  Stress: Not on file  Social Connections: Not on file  Intimate Partner Violence: Not on file    Current Outpatient Medications on File Prior to Visit  Medication Sig Dispense Refill   vitamin C (ASCORBIC ACID) 500 MG tablet Take 500 mg by mouth daily.     zinc gluconate 50 MG tablet Take 50 mg by mouth daily.     No current facility-administered medications on file prior to visit.    No Known Allergies  Family History  Problem Relation Age of Onset   Other Father        Cleidocranial dysplasia    BP 118/80 (BP Location: Right Arm, Patient Position: Sitting, Cuff Size: Normal)   Pulse 63   Ht 5' (1.524 m)   Wt 99 lb 6.4 oz (45.1 kg)   SpO2 97%   BMI 19.41 kg/m    Review of Systems Mother reports enuresis, impulsive eating and defiance.      Objective:   Physical Exam VS: see vs page GEN: no distress  HEAD: head: no deformity eyes: no periorbital swelling, no proptosis external nose and ears are normal NECK: supple, thyroid is not enlarged CHEST WALL: no deformity LUNGS: clear to auscultation BREASTS:  No gynecomastia CV: reg rate and rhythm, no murmur GENITALIA:  Normal adult male.   MUSCULOSKELETAL: no addressed here EXTEMITIES: no leg edema NEURO: sensation is intact to touch on all 4's SKIN:  Normal texture and temperature.  No rash or suspicious lesion is visible.  Normal male hair distribution. NODES:  None palpable at the neck PSYCH: alert, cooperative.  Does not appear anxious nor depressed.  Speaks little.     Lab Results  Component Value Date   CALCIUM 9.5 01/16/2021   Lab Results  Component Value Date   TSH 1.440 02/11/2021   T4TOTAL 7.7 02/11/2021   Lab Results  Component Value Date   ALT 11 02/11/2021   AST 16 02/11/2021   ALKPHOS 210 (H)  02/11/2021   BILITOT 0.5 02/11/2021   Lab Results  Component Value Date   CREATININE 0.57 (L) 01/16/2021   BUN 9 01/16/2021   NA 138 01/16/2021   K 4.0 01/16/2021   CL 102 01/16/2021   CO2 22 01/16/2021      Assessment & Plan:  Elev AP: check bone fraction.   Hypophosphatemia: recheck today  Patient Instructions  Blood tests are requested for you today.  We'll let you know about the results.

## 2021-05-26 LAB — ALKALINE PHOSPHATASE, BONE SPECIFIC: ALKALINE PHOSPHATASE, BONE SPECIFIC: 44.1 mcg/L — ABNORMAL HIGH (ref 8.4–29.3)

## 2021-05-26 LAB — PTH, INTACT AND CALCIUM
Calcium: 9.6 mg/dL (ref 8.9–10.4)
PTH: 16 pg/mL (ref 16–77)

## 2021-05-26 LAB — ACTH: C206 ACTH: 27 pg/mL (ref 6–50)

## 2021-05-30 LAB — ARGININE VASOPRESSIN HORMONE
ADH: <0.8 pg/mL (ref 0.0–4.7)
Osmolality Meas: 287 mOsmol/kg (ref 275–295)

## 2021-07-13 IMAGING — CT CT T SPINE W/O CM
3 series · 12 of 33 positions shown, 14 images · non-contrast
Comparison: Thoracolumbar radiographs 04/08/2012.

CLINICAL DATA: 19-year-old male with congenital scoliosis. Broken
hardware and back pain for 1 month.

EXAM:
CT THORACIC SPINE WITHOUT CONTRAST
TECHNIQUE: Multidetector CT images of the thoracic were obtained using the
standard protocol without intravenous contrast.

[Series 6: t spine soft · axial · 0.35mm/px · z∈[-281,-87]mm · 4 of 141 slices shown, 5 images]
[im 22/141  soft-tissue]
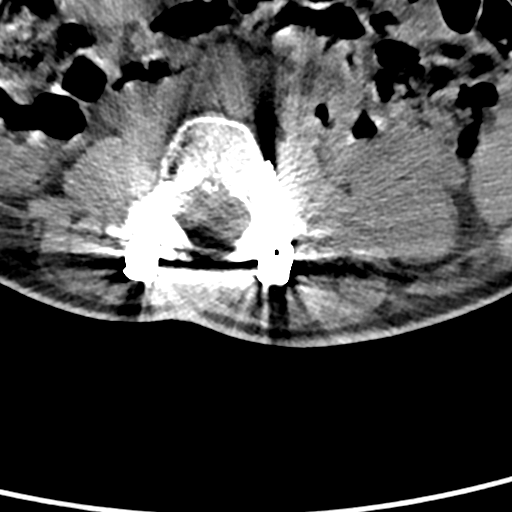
[im 22/141  bone]
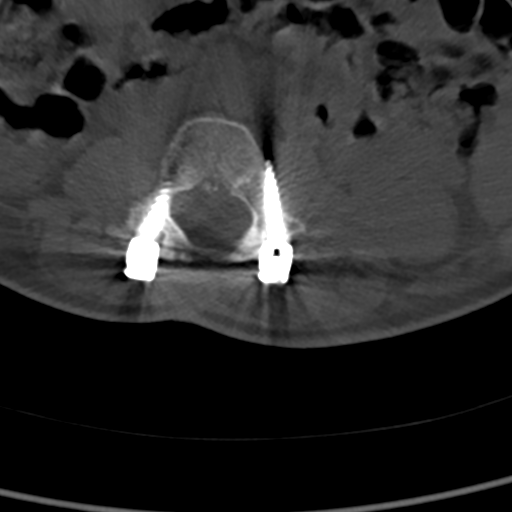
[im 54/141  bone]
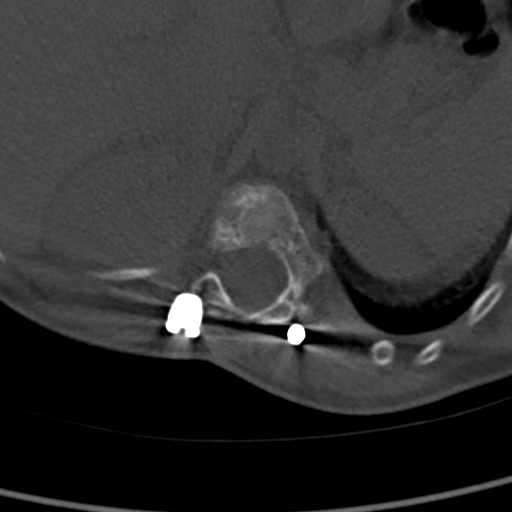
[im 87/141  bone]
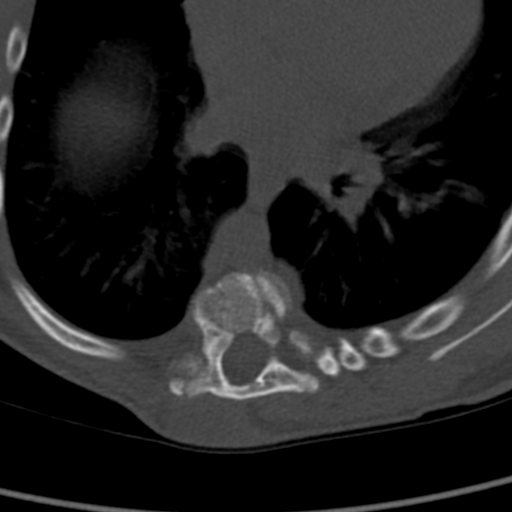
[im 119/141  bone]
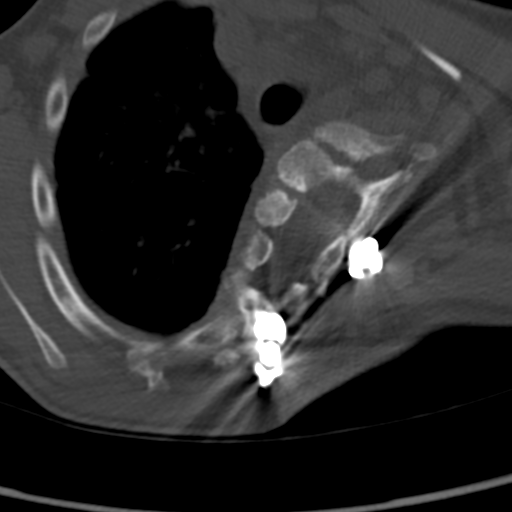

[Series 7: sagittal bone · sagittal · 0.41mm/px · 5 of 66 slices shown, 6 images]
[im 22/66  bone]
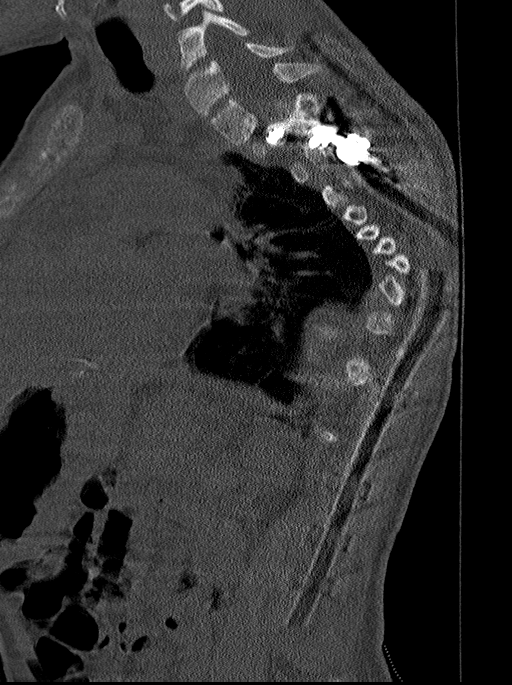
[im 28/66  bone]
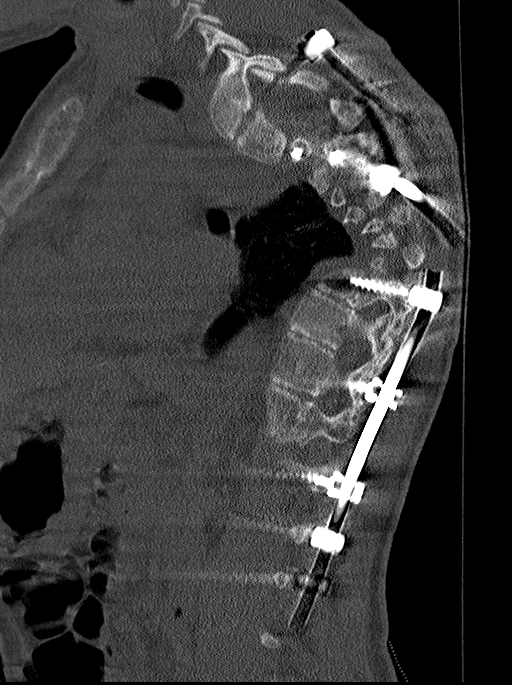
[im 33/66  soft-tissue]
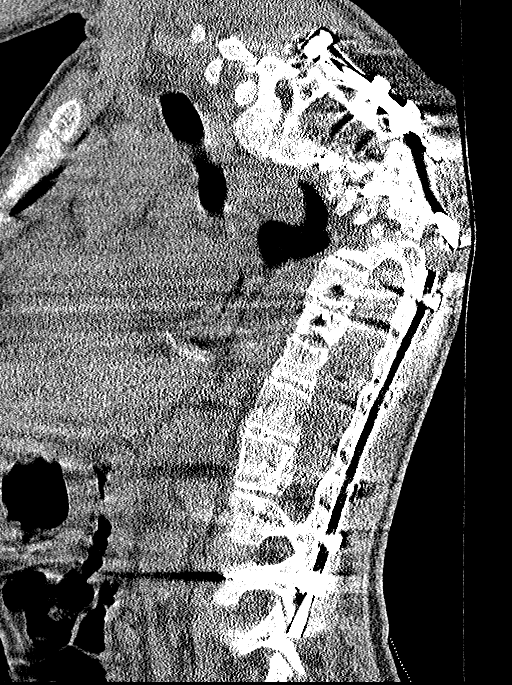
[im 33/66  bone]
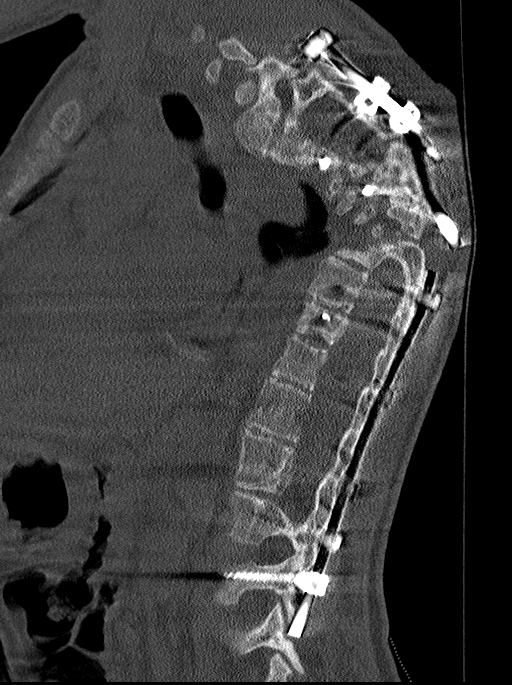
[im 38/66  bone]
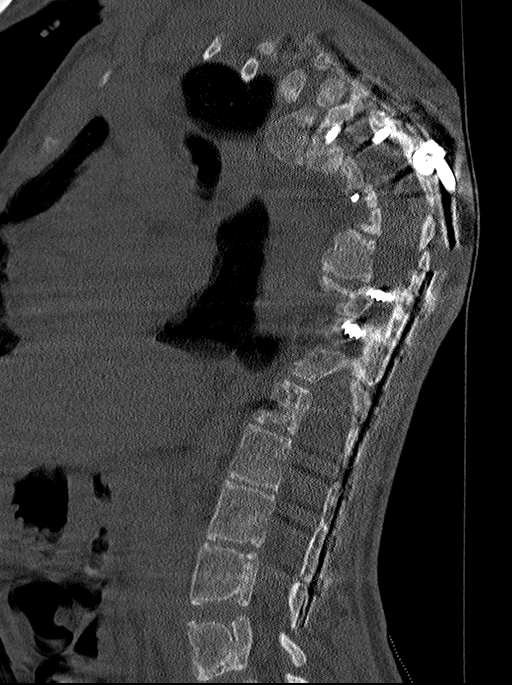
[im 44/66  bone]
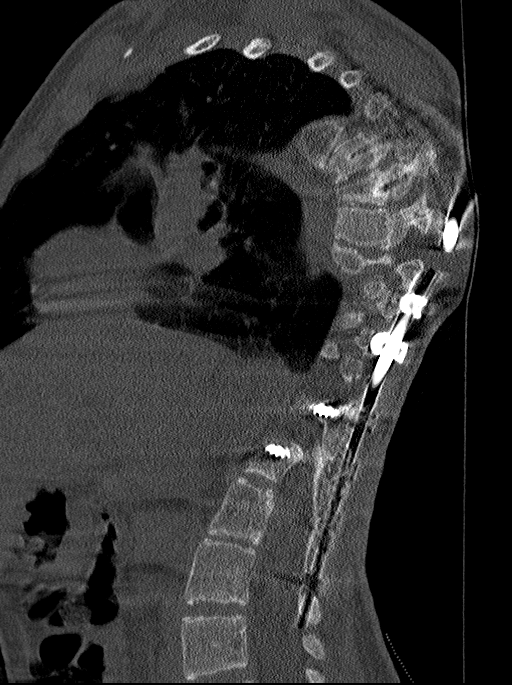

[Series 8: coronal bone · coronal · 0.38mm/px · 3 of 88 slices shown]
[im 18/88  bone]
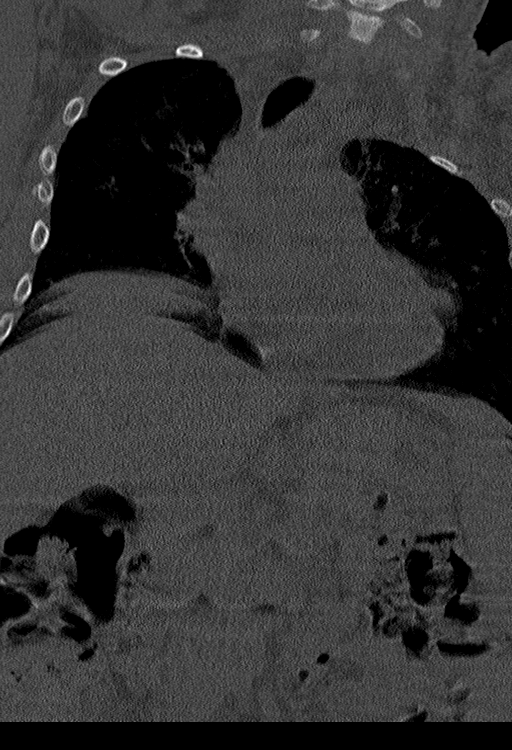
[im 35/88  bone]
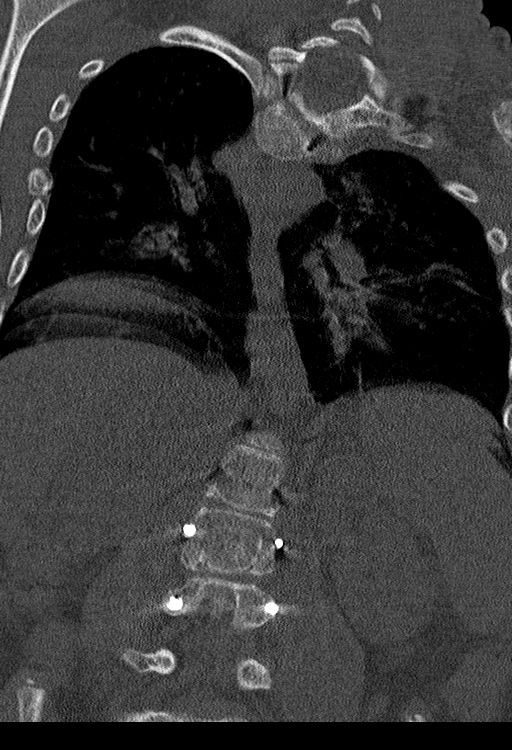
[im 53/88  bone]
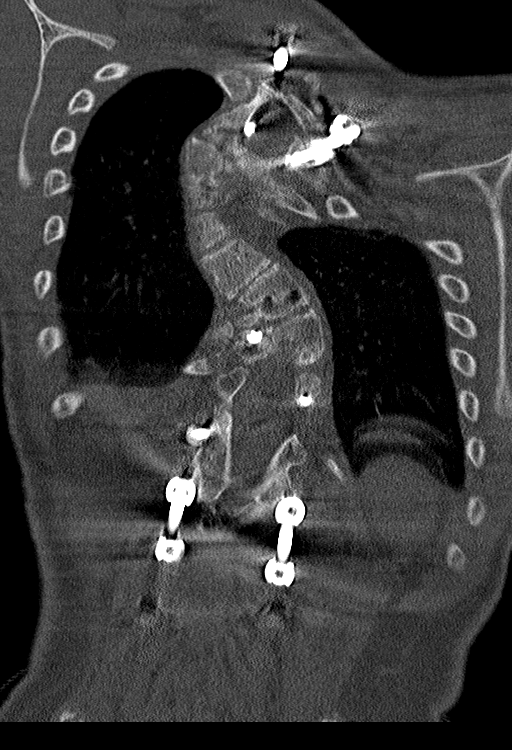

[12 of 33 positions shown; findings below may reference images not displayed]

FINDINGS: Limited cervical spine imaging: C5 spina bifida occulta.
Satisfactory cervicothoracic junction alignment.

Thoracic spine segmentation: Hypoplastic ribs at T12, especially on
the left. Segmentation otherwise within normal limits.

Alignment: Severe thoracic kyphoscoliosis. Dextroconvex upper
thoracic spine curvature of almost 90 degrees. Superimposed kyphosis
about the T7 level of roughly 100 degrees. Superimposed levoconvex
lower thoracic scoliosis of approximately 60 degrees.

Vertebrae:

Vertebra are not fully skeletally mature. Spina bifida occulta at L4
(series 4, image 133) and possibly also L5 (image 141).

Evidence of posterior element arthrodesis in the thoracic spine
except at T6-T7, T7-T8.

No acute osseous abnormality identified.  Hardware details below.

Paraspinal and other soft tissues: Negative visible noncontrast
thoracic and abdominal viscera.

Disc levels:

Capacious spinal canal.  No CT evidence of spinal stenosis.

Best seen on the scout view both posterior spinal rods are fractured
at roughly the T8 level, and distracted.

Posterior spinal hardware begins at the L2 pedicle level. And there
is lucency surrounding the right L2 pedicle screw (series 7, image
36) suggestive of loosening.

There is a somewhat medial course of the right T4 and T5 pedicle
screws (series 8, images 54 and 59).

Similar medial course of the right T6 pedicle screw (series 4, image
39).

Similar medial course of the right T9 pedicle screw (series 4, image
61).

There is a slightly lateral course of the left L2 pedicle screw
(series 4, image 107).

The hardware terminates at L3.
IMPRESSION: 1. Severe thoracic kyphoscoliosis. Severe kyphosis about the T7-T8
level, with severe dextroconvex upper and levoconvex lower thoracic
scoliosis.

2. Thoracic posterior element arthrodesis suspected except at T6-T7,
T7-T8. The posterior spinal rods fractured at the T7-T8 level.

3. Evidence of loosening of the right L2 pedicle screw. Slightly
medial course of right side pedicle screws T4 through T9. Slightly
lateral course of the left L2 pedicle screw.

4. No acute osseous abnormality identified. No CT evidence of
thoracic spinal stenosis.

## 2021-07-28 IMAGING — CR DG CHEST 2V
2 series · 2 of 2 positions shown · non-contrast
Comparison: Chest radiographs 06/17/2008.

CLINICAL DATA: 19-year-old male status post revision of spine
surgery 3 days ago, fall today. Scoliosis.

EXAM:
CHEST - 2 VIEW

[chest lat]
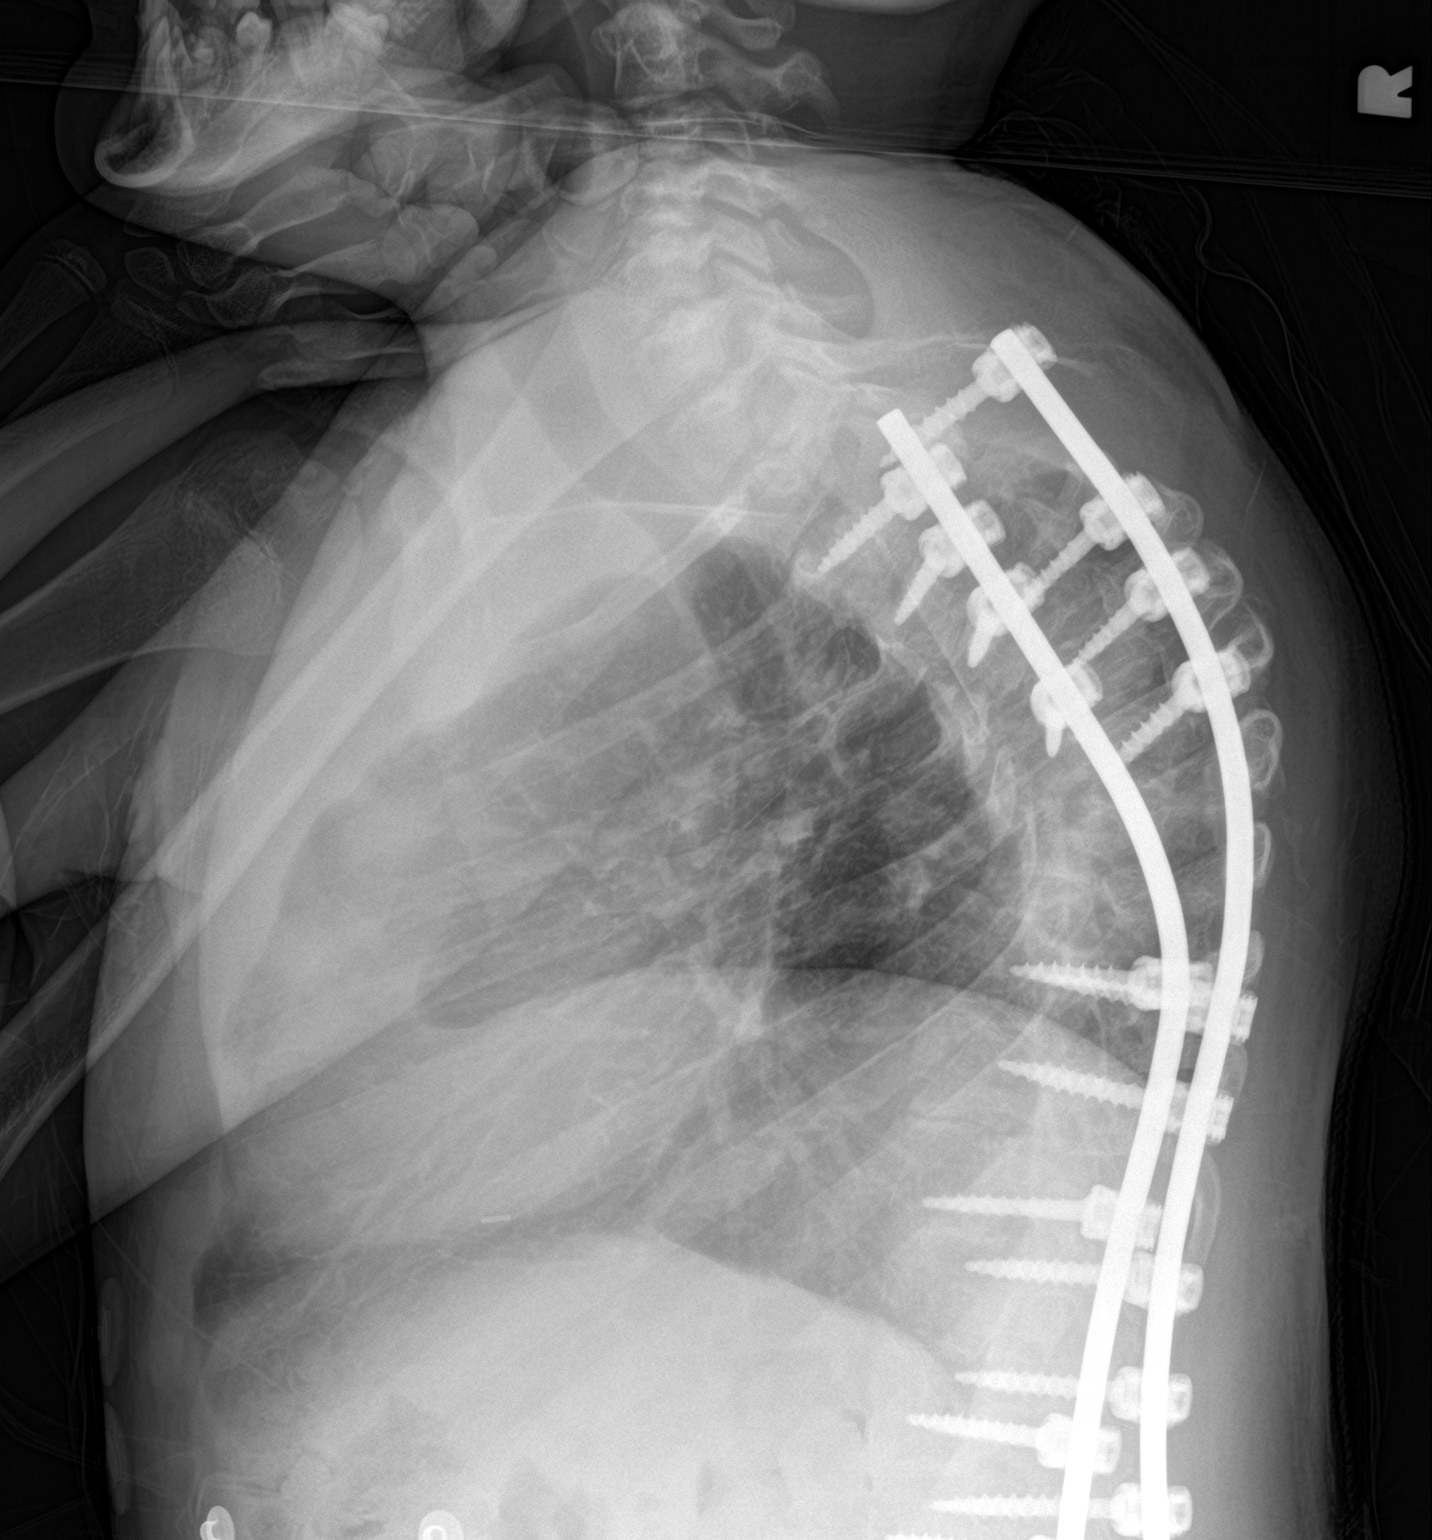

[chest ap]
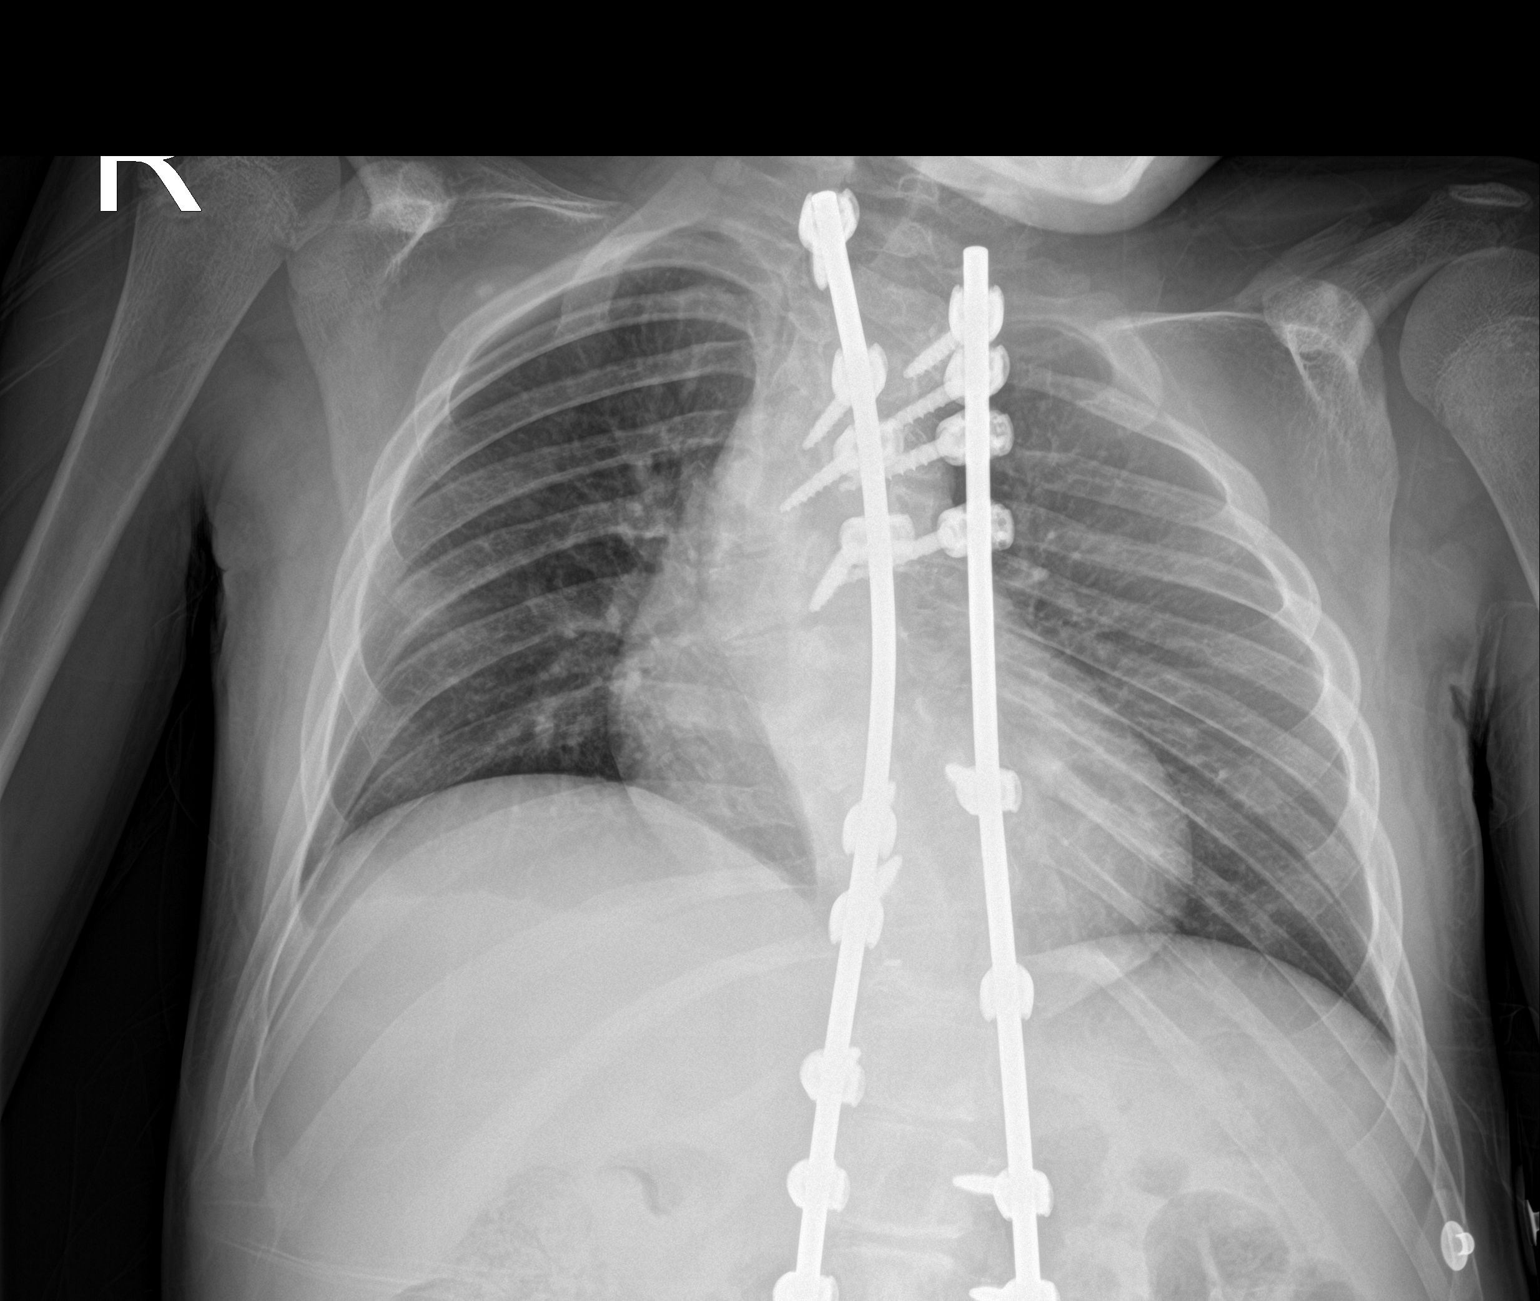

[2 of 2 positions shown; findings below may reference images not displayed]

FINDINGS: AP supine and lateral views of the chest. Thoracic kyphoscoliosis
with posterior pedicle screws and connecting rods in place from
approximately the right T2 level inferiorly. No fracture of the
visible hardware.

Hypoplastic or absent clavicles as before. No acute osseous
abnormality identified.

Stable lung volumes and mediastinal contours. No pneumothorax,
pulmonary edema, pleural effusion or confluent pulmonary opacity.

Negative visible bowel gas pattern.
IMPRESSION: 1. No acute cardiopulmonary abnormality.
2. Thoracic kyphoscoliosis with intact visible spinal hardware.
3. Hypoplastic/absent clavicles. No acute osseous abnormality
identified.

## 2021-08-14 ENCOUNTER — Telehealth (HOSPITAL_BASED_OUTPATIENT_CLINIC_OR_DEPARTMENT_OTHER): Payer: Self-pay | Admitting: Nurse Practitioner

## 2021-08-14 NOTE — Telephone Encounter (Signed)
Mother came in to drop off special [employer] paperwork to just be signed. Paperwork is left in providers Red dot tray. Please advise.

## 2021-08-28 NOTE — Telephone Encounter (Signed)
Pt mother/caregiver is calling again regarding this paperwork being filled out and when she can come get it. Pt would like CMA to give her a call.  Please advise.

## 2021-11-11 DIAGNOSIS — H906 Mixed conductive and sensorineural hearing loss, bilateral: Secondary | ICD-10-CM | POA: Diagnosis not present

## 2022-01-06 DIAGNOSIS — H906 Mixed conductive and sensorineural hearing loss, bilateral: Secondary | ICD-10-CM | POA: Diagnosis not present

## 2022-01-06 DIAGNOSIS — H6983 Other specified disorders of Eustachian tube, bilateral: Secondary | ICD-10-CM | POA: Diagnosis not present

## 2022-01-06 DIAGNOSIS — H6523 Chronic serous otitis media, bilateral: Secondary | ICD-10-CM | POA: Diagnosis not present

## 2022-01-06 DIAGNOSIS — Q759 Congenital malformation of skull and face bones, unspecified: Secondary | ICD-10-CM | POA: Diagnosis not present

## 2022-03-27 DIAGNOSIS — K047 Periapical abscess without sinus: Secondary | ICD-10-CM | POA: Diagnosis not present

## 2022-03-27 DIAGNOSIS — H906 Mixed conductive and sensorineural hearing loss, bilateral: Secondary | ICD-10-CM | POA: Diagnosis not present

## 2022-03-28 DIAGNOSIS — J40 Bronchitis, not specified as acute or chronic: Secondary | ICD-10-CM | POA: Diagnosis not present

## 2022-04-24 DIAGNOSIS — H6523 Chronic serous otitis media, bilateral: Secondary | ICD-10-CM | POA: Diagnosis not present

## 2022-04-24 DIAGNOSIS — H6993 Unspecified Eustachian tube disorder, bilateral: Secondary | ICD-10-CM | POA: Diagnosis not present

## 2022-04-24 DIAGNOSIS — H906 Mixed conductive and sensorineural hearing loss, bilateral: Secondary | ICD-10-CM | POA: Diagnosis not present

## 2022-04-24 DIAGNOSIS — Q759 Congenital malformation of skull and face bones, unspecified: Secondary | ICD-10-CM | POA: Diagnosis not present

## 2022-04-28 ENCOUNTER — Encounter: Payer: Self-pay | Admitting: Internal Medicine

## 2022-04-28 ENCOUNTER — Ambulatory Visit (INDEPENDENT_AMBULATORY_CARE_PROVIDER_SITE_OTHER): Payer: BC Managed Care – PPO | Admitting: Internal Medicine

## 2022-04-28 VITALS — BP 136/72 | HR 84 | Temp 98.7°F | Resp 12 | Ht 60.0 in | Wt 97.0 lb

## 2022-04-28 DIAGNOSIS — Q740 Other congenital malformations of upper limb(s), including shoulder girdle: Secondary | ICD-10-CM

## 2022-04-28 DIAGNOSIS — R451 Restlessness and agitation: Secondary | ICD-10-CM

## 2022-04-28 DIAGNOSIS — F639 Impulse disorder, unspecified: Secondary | ICD-10-CM

## 2022-04-28 DIAGNOSIS — H918X2 Other specified hearing loss, left ear: Secondary | ICD-10-CM | POA: Diagnosis not present

## 2022-04-28 DIAGNOSIS — F913 Oppositional defiant disorder: Secondary | ICD-10-CM

## 2022-04-28 DIAGNOSIS — R4587 Impulsiveness: Secondary | ICD-10-CM

## 2022-04-28 DIAGNOSIS — Z9889 Other specified postprocedural states: Secondary | ICD-10-CM

## 2022-04-28 DIAGNOSIS — Z8719 Personal history of other diseases of the digestive system: Secondary | ICD-10-CM

## 2022-04-28 DIAGNOSIS — R053 Chronic cough: Secondary | ICD-10-CM | POA: Insufficient documentation

## 2022-04-28 DIAGNOSIS — Z9089 Acquired absence of other organs: Secondary | ICD-10-CM

## 2022-04-28 MED ORDER — RISPERIDONE 1 MG PO TABS: 1.0000 mg | ORAL_TABLET | Freq: Every day | ORAL | 5 refills | Status: DC

## 2022-04-28 NOTE — Assessment & Plan Note (Signed)
Controlled best by male support in home which mother now has Offered BH/psych declined as the therapy generally doesn't stick.

## 2022-04-28 NOTE — Patient Instructions (Addendum)
It was a pleasure seeing you today!  Today the plan is...  Agitation  Cleidocranial dysplasia  Premature infant of [redacted] weeks gestation  Other specified hearing loss of left ear, unspecified hearing status on contralateral side  History of adenoidectomy  H/O bilateral inguinal hernia repair  History of Nissen fundoplication  History of gastrostomy  Impulsiveness  Chronic cough Assessment & Plan: Gust and decided not to focus on this issue today and he did not cough a single time at the visit so seems the Benadryl is working pretty well and this is stable and most likely just due to chronic allergies.   Disorder of impulse control Assessment & Plan: This can be problematic as he has inappropriate behaviors and has not always been well controlled although they do a good job at school managing this and is mother's behavioral health therapist the offer of official behavioral health referral or psychiatric referral was declined on the basis that he has trouble getting much out of session based therapy possibly as part of his developmental delay.  He does do really well with males and her new husband behavior management in the home which has been good lately.      Loralee Pacas, MD   Return in about 1 year (around 04/29/2023), or if symptoms worsen or fail to improve, for Annual Exam.

## 2022-04-28 NOTE — Assessment & Plan Note (Signed)
This can be problematic as he has inappropriate behaviors and has not always been well controlled although they do a good job at school managing this and is mother's behavioral health therapist the offer of official behavioral health referral or psychiatric referral was declined on the basis that he has trouble getting much out of session based therapy possibly as part of his developmental delay.  He does do really well with males and her new husband behavior management in the home which has been good lately.

## 2022-04-28 NOTE — Assessment & Plan Note (Addendum)
We discussed and decided not to focus on this issue today and he did not cough a single time at the visit so seems the Benadryl is working pretty well and this is stable and most likely just due to chronic allergies.

## 2022-04-28 NOTE — Progress Notes (Signed)
Today's healthcare provider: Loralee Pacas, MD  Phone: [phone]  New patient visit  Visit Date: 04/28/2022 Patient: Edwin Price   DOB: [birth year]   21 y.o. Male  MRN: [id number]  Assessment and Plan:   Focused on updating his problem list and his team care list for adult care and then try to develop support options to help with Edwin Price's agitation and impulsivity which can be difficult for his mother to manage at times but she is showing tremendous skill  Edwin Price was seen today for cough and establish care.  Agitation -     risperiDONE; Take 1 tablet (1 mg total) by mouth at bedtime.  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    History of gastrostomy Overview: Couldn't eat first 2 years.    Impulsiveness  Chronic cough Overview: Not currently, OTC Benadryl helpsNot currently, OTC Benadryl helps more than prescription antibiotics. Does see ENT. more than prescription antibiotics. Does see ENT.   Assessment & Plan: We discussed and decided not to focus on this issue today and he did not cough a single time at the visit so seems the Benadryl is working pretty well and this is stable and most likely just due to chronic allergies.   Disorder of impulse control Assessment & Plan: This can be problematic as he has inappropriate behaviors and has not always been well controlled although they do a good job at school managing this and is mother's behavioral health therapist the offer of official behavioral health referral  or psychiatric referral was declined on the basis that he has trouble getting much out of session based therapy possibly as part of his developmental delay.  He does do really well with males and her new husband behavior management in the home which has been good lately.      Health Maintenance  Topic Date Due   HPV VACCINES (1 - Male 2-dose series) Never done   TETANUS/TDAP  01/30/2026   INFLUENZA VACCINE  Discontinued   COVID-19 Vaccine  Discontinued   Hepatitis C Screening  Discontinued   HIV Screening  Discontinued     Recommended follow up: Return in about 1 year (around 04/29/2023), or if symptoms worsen or fail to improve, for Annual Exam.   Subjective:  Patient presents today to establish care.  Prior patient of [facility].  Chief Complaint  Patient presents with   Cough    Not currently, OTC Benadryl helps more than prescription antibiotics. Does see ENT.   Establish Care    Wants to establish care as an adult (coming from Pediatrics office)    For history taking, I took a per problem history from the patient and chart review as follows: Problem  Premature Infant of [redacted] Weeks Gestation  History of Adenoidectomy  H/O Bilateral Inguinal Hernia Repair   At about 39 months old No mesh    History of Nissen Fundoplication   Couldn't eat first 2 years without aspiration, nissen corrected.    History of Gastrostomy   Couldn't eat first 2 years.    Chronic Cough   Not  currently, OTC Benadryl helpsNot currently, OTC Benadryl helps more than prescription antibiotics. Does see ENT. more than prescription antibiotics. Does see ENT.    Disorder of Impulse Control  Hearing Loss   Wears hearing aids Follows audiology   Cleidocranial Dysplasia   Born without collar bones   Establishing Care With New Doctor, Encounter for (Resolved)  Need for Meningococcal Vaccination (Resolved)     Depression Screen    04/28/2022    8:28 AM  PHQ 2/9 Scores  PHQ - 2 Score  0    The following were reviewed and entered/updated in epic: Past Medical History:  Diagnosis Date   Arachnoid cyst 05/27/2015   Asthma    as a child   Back disorder    Child with short stature 01/16/2021   Chronic cough 04/28/2022   Not currently, OTC Benadryl helpsNot currently, OTC Benadryl helps more than prescription antibiotics. Does see ENT. more than prescription antibiotics. Does see ENT.   Cleidocranial dysplasia    Difficulty hearing 01/16/2021   H/O bilateral inguinal hernia repair 04/28/2022   At about 6 months old   Hearing loss 01/16/2021   History of gastrostomy 04/28/2022   Couldn't eat first 2 years.    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   Past Surgical History:  Procedure Laterality Date   BACK SURGERY     MULTIPLE   CIRCUMCISION     HERNIA REPAIR     MYRINGOTOMY WITH TUBE PLACEMENT Bilateral 08/08/2018   Procedure: MYRINGOTOMY WITH BILATERAL TUBE PLACEMENT;  Surgeon: Izora Gala, MD;  Location: [facility] OR;  Service: ENT;  Laterality: Bilateral;   TONSILLECTOMY AND ADENOIDECTOMY     TYMPANOSTOMY TUBE PLACEMENT     Past Surgical History:  Procedure Laterality Date   BACK SURGERY     MULTIPLE   CIRCUMCISION     HERNIA REPAIR     MYRINGOTOMY WITH TUBE PLACEMENT Bilateral 08/08/2018   Procedure: MYRINGOTOMY WITH BILATERAL TUBE PLACEMENT;  Surgeon: Izora Gala, MD;  Location: [facility] OR;  Service: ENT;  Laterality: Bilateral;   TONSILLECTOMY AND ADENOIDECTOMY     TYMPANOSTOMY TUBE PLACEMENT     Family Status  Relation Name Status   Mother  Alive   Father  Alive   Sister 24 YO Alive   Sister 66 YO Alive   Brother 7 YO Alive   Brother 57 YO Alive   Brother 26 YO [place] Deceased   MGM  Alive    MGF  Alive   [place]  Alive   PGF  Alive   Family History  Problem Relation Age of Onset   Hearing loss Father    Other Father        Cleidocranial dysplasia   Hypertension Maternal Grandmother    Outpatient Medications Prior to Visit  Medication Sig Dispense Refill   diphenhydrAMINE (BENADRYL) 12.5 MG chewable tablet Chew 12.5 mg by mouth 4 (four) times daily as needed for allergies. PRN     vitamin C (ASCORBIC ACID) 500 MG tablet Take 500 mg by mouth daily. PRN     zinc gluconate 50 MG tablet Take 50 mg by mouth daily. PRN     No facility-administered medications prior  to visit.    No Known Allergies Social History   Tobacco Use   Smoking status: Never   Smokeless tobacco: Never  Vaping Use   Vaping Use: Never used  Substance Use Topics   Alcohol use: Never   Drug use: Never    Immunization History  Administered Date(s) Administered   DTaP 01/02/2002, 03/03/2002, 03/03/2002, 11/08/2002, 08/12/2005   DTaP / Hep B / IPV 11/14/2001, 01/02/2002   HIB (PRP-OMP) 07/28/2002   HIB (PRP-T) 07/28/2002   Hepatitis A 08/12/2005, 04/04/2007   Hepatitis A, Ped/Adol-2 Dose 08/12/2005, 04/04/2007   Hepatitis B 09/27/2001, 11/14/2001, 01/02/2002, 03/03/2002   Hepatitis B, PED/ADOLESCENT 09/27/2001, 11/07/2001, 03/03/2002   IPV 11/14/2001, 01/02/2002, 07/28/2002, 08/12/2005   Influenza Split 05/11/2003, 04/04/2007   Influenza, Seasonal, Injecte, Preservative Fre 05/31/2006   MMR 07/28/2002, 08/12/2005, 03/31/2017   MenQuadfi_Meningococcal Groups ACYW Conjugate 01/31/2016   Meningococcal Conjugate 02/11/2021   PFIZER(Purple Top)SARS-COV-2 Vaccination 03/22/2020, 05/03/2020   Pneumococcal-Unspecified 11/07/2001, 11/07/2001, 01/02/2002, 01/02/2002, 03/03/2002, 03/03/2002, 07/28/2002, 07/28/2002   Tdap 01/31/2016   Varicella 11/08/2002, 08/12/2005  Gardasil declined by other 04/28/22 after discussion.  Objective:  BP 136/72 (BP Location: Left Arm, Patient Position: Sitting)   Pulse  84   Temp 98.7 F (37.1 C) (Temporal)   Resp 12   Ht 5' (1.524 m)   Wt 97 lb (44 kg)   SpO2 97%   BMI 18.94 kg/m  Body mass index is 18.94 kg/m.  He  is a developmentally delayed young male who exhibits marked behavioral disturbances.  In appropriately timed laughter, inappropriate touching and grabbing, unexpected responses to simple questions.   Gen: NAD, restless.  Lots of unnecessary movement..  skeletal dysplasia of missing collar bone. HEENT: Mucous membranes are moist. Sclera conjunctiva and lids grossly normal Neck: no thyromegaly, no cervical lymphadenopathy Craniofacial underdevelopment of cheek and mandible, wide prominent forehead. Frequent enunciates "I'll kick your a$$" and "shut up" and grabs mothers legs, not able to carry on logical conversations but capable of some communication regarding his wants and needs.   Results for orders placed or performed in visit on 05/21/21  Alkaline phosphatase, bone specific  Result Value Ref Range   ALKALINE PHOSPHATASE, BONE SPECIFIC 44.1 (H) 8.4 - 29.3 mcg/L  Arginine vasopressin hormone  Result Value Ref Range   ADH <0.8 0.0 - 4.7 pg/mL   Osmolality Meas 287 275 - 295 mOsmol/kg  Basic metabolic panel  Result Value Ref Range   Sodium 139 135 - 145 mEq/L   Potassium 3.7 3.5 - 5.1 mEq/L   Chloride 103 96 - 112 mEq/L   CO2 26 19 - 32 mEq/L   Glucose, Bld 77 70 - 99 mg/dL   BUN 11 6 - 23 mg/dL   Creatinine, Ser 0.54 0.40 - 1.50 mg/dL   GFR 144.20 >60.00 mL/min   Calcium 9.4 8.4 - 10.5 mg/dL  Urinalysis, Routine w reflex microscopic  Result Value Ref Range   Color, Urine YELLOW Yellow;Lt. Yellow;Straw;Dark Yellow;Amber;Green;Red;Brown   APPearance CLEAR Clear;Turbid;Slightly Cloudy;Cloudy   Specific Gravity, Urine 1.020 1.000 - 1.030   pH 6.0 5.0 - 8.0   Total Protein, Urine NEGATIVE Negative   Urine Glucose NEGATIVE Negative   Ketones, ur NEGATIVE Negative   Bilirubin Urine NEGATIVE Negative   Hgb urine dipstick NEGATIVE  Negative   Urobilinogen, UA 0.2 0.0 - 1.0   Leukocytes,Ua NEGATIVE Negative   Nitrite NEGATIVE Negative   WBC, UA 0-2/hpf 0-2/hpf   RBC / HPF none seen 0-2/hpf  ACTH  Result Value Ref  Range   C206 ACTH 27 6 - 50 pg/mL  Cortisol  Result Value Ref Range   Cortisol, Plasma 9.9 ug/dL  Phosphorus  Result Value Ref Range   Phosphorus 3.3 (L) 4.5 - 5.5 mg/dL  Magnesium  Result Value Ref Range   Magnesium 2.0 1.5 - 2.5 mg/dL  PTH, intact and calcium  Result Value Ref Range   PTH 16 16 - 77 pg/mL   Calcium 9.6 8.9 - 10.4 mg/dL  VITAMIN D 25 Hydroxy (Vit-D Deficiency, Fractures)  Result Value Ref Range   VITD 34.89 30.00 - 100.00 ng/mL

## 2022-05-07 DIAGNOSIS — Q675 Congenital deformity of spine: Secondary | ICD-10-CM | POA: Diagnosis not present

## 2022-05-07 DIAGNOSIS — M4325 Fusion of spine, thoracolumbar region: Secondary | ICD-10-CM | POA: Diagnosis not present

## 2022-05-07 DIAGNOSIS — Z981 Arthrodesis status: Secondary | ICD-10-CM | POA: Diagnosis not present

## 2022-05-07 DIAGNOSIS — M419 Scoliosis, unspecified: Secondary | ICD-10-CM | POA: Diagnosis not present

## 2022-05-14 NOTE — H&P (Signed)
HPI:   Edwin Price is a 21 y.o. male who presents as a return Patient.   Current problem: Ears.  HPI: Trouble with hearing aids  PMH/Meds/All/SocHx/FamHx/ROS:   History reviewed. No pertinent past medical history.  Past Surgical History:  Procedure Laterality Date   ADENOIDECTOMY   BACK SURGERY   HERNIA REPAIR   TONSILLECTOMY   TYMPANOSTOMY TUBE PLACEMENT   No family history of bleeding disorders, wound healing problems or difficulty with anesthesia.   Social History   Socioeconomic History   Marital status: Single  Spouse name: Not on file   Number of children: Not on file   Years of education: Not on file   Highest education level: Not on file  Occupational History   Not on file  Tobacco Use   Smoking status: Never   Smokeless tobacco: Never  Vaping Use   Vaping Use: Never used  Substance and Sexual Activity   Alcohol use: Never   Drug use: Never   Sexual activity: Never  Other Topics Concern   Not on file  Social History Narrative   Not on file   Social Determinants of Health   Financial Resource Strain: Not on file  Food Insecurity: Not on file  Transportation Needs: Not on file  Physical Activity: Not on file  Stress: Not on file  Social Connections: Not on file  Housing Stability: Not on file   Current Outpatient Medications:   ascorbic acid (VITAMIN C ORAL), Take by mouth., Disp: , Rfl:   ZINC ORAL, Take by mouth., Disp: , Rfl:    Physical Exam:   Here for follow-up. He had continued trouble with his hearing aids and has been using replacement hearing aids recently. They are picking up his old ones today that have been here for a few months. There was mention of possibly considering [place] for him. On exam he has moist ceruminous debris bilaterally. Left side was obstructing so I cleaned this out with a curette. The drums are thickened and there does appear to be middle ear effusion on both sides.  Independent Review of Additional Tests or  Records:  Audiogram reveals severe low-frequency conductive loss and high-frequency sensorineural loss bilaterally. The conductive loss did not change when he had tubes. Tympanograms are almost flatline.  Procedures:  none  Impression & Plans:  We will see how he does with his hearing aids now that the ear was cleaned out on the left. We had a discussion about the possibility of Baha. There would be advantages such as not being as concerned about the earwax but disadvantage of an operation and the cost of replacing them should they be damaged multiple times. Mom will think about this and will let us know.

## 2022-05-19 ENCOUNTER — Encounter (HOSPITAL_COMMUNITY): Payer: Self-pay | Admitting: Otolaryngology

## 2022-05-19 NOTE — Anesthesia Preprocedure Evaluation (Signed)
Anesthesia Evaluation Anesthesia Physical Anesthesia Plan  ASA:   Anesthesia Plan:    Post-op Pain Management:    Induction:   PONV Risk Score and Plan:   Airway Management Planned:   Additional Equipment:   Intra-op Plan:   Post-operative Plan:   Informed Consent:   Plan Discussed with:   Anesthesia Plan Comments: (PAT note written 05/19/2022 by Myra Gianotti, PA-C. )        Anesthesia Quick Evaluation

## 2022-05-19 NOTE — Progress Notes (Signed)
Internal Med - Dr Berniece Pap Newt Lukes, AuD CCC-A Cardiologist - n/a  Chest x-ray - n/a EKG - n/a Stress Test - n/a ECHO - 02/09/18 Cardiac Cath - n/a  ICD Pacemaker/Loop - n/a  Sleep Study -  Yes CPAP - refuses to use CPAP  STOP now taking any Aspirin (unless otherwise instructed by your surgeon), Aleve, Naproxen, Ibuprofen, Motrin, Advil, Goody's, BC's, all herbal medications, fish oil, and all vitamins.   Coronavirus Screening Does the patient have any of the following symptoms:  Cough Yes Fever (>100.79F)  yes/no: No Runny nose yes/no: No Sore throat yes/no: No Difficulty breathing/shortness of breath  yes/no: No  Has the patient traveled in the last 14 days and where? yes/no: No  Mother Winter T[place] [phone]) verbalized understanding of instructions that were given

## 2022-05-19 NOTE — Progress Notes (Signed)
Anesthesia Chart Review: Kathleene Hazel  Case: [id number] Date/Time: 05/20/22 1216   Procedure: MYRINGOTOMY WITH TUBE PLACEMENT (Bilateral)   Anesthesia type: General   Pre-op diagnosis: Dysfunction of both eustachian tubes; Bilateral chronic serous otitis media; Mixed conductive and sensorineural hearing loss of both ears; Bilateral impacted cerumen   Location: [facility] OR ROOM 09 / [place]   Surgeons: Izora Gala, MD       DISCUSSION: Patient is a 21 year old male scheduled for the above procedure. He had similar procedure here on 08/08/18.   History includes never smoker, Cleidocranial Dysotosis, scoliosis/congenital musculoskeletal deformity of the spine (s/p Growing Rod placement 03/11/12, s/p lengthening 04/28/13, 03/23/14; replacement of Growing Rod and hooks 02/22/15; removal of lumbar/thoracic instrumentation and placement of Halo traction device 11/12/17; halo removal & T2-L3 PSF 02/02/28; Revision instrumentation with osteotomy T6-7, takedown of nonunion at T7-8, fusion fro T6-8 06/11/20), development delay, childhood asthma, OSA (severe with AHI 26 01/19/18, intolerant to CPAP), hearing loss ("severe low-frequency conductive loss and high-frequency sensorineural loss bilaterally. The conductive loss did not change when he had tubes. Tympanograms are almost flatline" 01/06/22), T&A, bilateral inguinal hernia repair. 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   Prior to 2019 spinal surgery he underwent cardiopulmonary evaluation. He was found to have severe OSA and was prescribed CPAP, but was unable to tolerate. He also had an echo 02/09/2018 that was structurally normal with normal LV size and function.  Anesthesia Records from 06/11/20 spinal surgery [facility]): Mask ventilation: easy  View: IIa, direct laryngoscopy ETT: Oral, Cuffed. Size: 6, secured at: 20 cm Insertion attempts: 1 Airway Assistance: Anterior Laryngeal Pressure  Anesthesia team to evaluate on the day of surgery. Last labs noted are  from 05/21/21. Labs as indicated on arrival.   VS: 04/28/22: BP 136/72, HR 84, WT 44 KG   PROVIDERS: Loralee Pacas, MD is PCP [facility]). Established care 04/28/22. Naoma Diener, MD is orthopedic surgeon Renato Shin, MD is endocrinologist   LABS: Labs are indicated on arrival. Most recent lab results in [facility] include: Lab Results  Component Value Date   WBC 5.7 01/16/2021   HGB 14.8 01/16/2021   HCT 46.0 01/16/2021   PLT 312 01/16/2021   GLUCOSE 77 05/21/2021   ALT 11 02/11/2021   AST 16 02/11/2021   NA 139 05/21/2021   K 3.7 05/21/2021   CL 103 05/21/2021   CREATININE 0.54 05/21/2021   BUN 11 05/21/2021   CO2 26 05/21/2021   TSH 1.440 02/11/2021     IMAGES: XR SCOLIOSIS AP AND LATERAL 05/07/2022 ([facility]): FINDINGS:  Redemonstration of posterior fusion from the T2-L3 levels, unchanged. No evidence of fractured hardware. Vertebral body heights alignment and disc spacing appears unchanged but is poorly evaluated in the thoracic spine and upper lumbar spine due to obscuring soft tissue and large field-of-view. Unchanged cervical lordosis, thoracic kyphosis, and lumbar lordosis. Persistent mild deformity of the left ribs. Bilateral dysplastic appearance of the hips. Soft tissues and visualized chest/abdomen are within normal limits.   PULM: CPAP Titration Study 02/06/2018 ([place]): Impression  1. CPAP 9 with EPR = 1 best supported respiration with much improved AHI compared to the baseline study.  This was tested in REM supine sleep.  Recommendations  1. CPAP 9 with EPR = 1, heated humidity for nasal dryness. Mask: Driscilla Grammes small/ medium nasal mask.   Sleep study 01/19/2018 ([place]): Impressions 1. Obstructive sleep apnea - severe (AHI 26)- the patient  should undergo airway evaluation for possible medical (PAP titration) or surgical therapy to promote airway patency.  If the child undergoes surgery, the patient  should be monitored closely postoperative and a follow up sleep study is suggested to assess residual sleep apnea. The patient was offered a CPAP titration as a split night, but the patient's mother declined the PAP titration. 2. Frequent arousals some of these are related to the respiratory events, this may be in part related to the sensors for the study.   Spirometry 10/04/2017:  Latest Reference Range & Units 10/04/17 11:13  FVC-Pre L 2.25  FVC-%Pred-Pre % 91  FEV1-Pre L 1.69  FEV1-%Pred-Pre % 73  Pre FEV1/FVC ratio % 75  FEV1FVC-%Pred-Pre % 88  FEF 25-75 Pre L/sec 1.38  FEF2575-%Pred-Pre % 45  FEV6-Pre L 2.25  FEV6-%Pred-Pre % 89  Pre FEV6/FVC Ratio % 100  FEV6FVC-%Pred-Pre % 97    EKG: EKG 06/14/2020: Sinus rhythm Short PR interval Borderline Q waves in lateral leads No old tracing to compare Confirmed by Daleen Bo [phone]) on 06/14/2020 4:24:58 PM   EKG 11/15/2017 ([facility]): Per Result Narrative in Care Everywhere:  Sinus rhythm with sinus arrhythmia with short PR [PR 100 ms] Otherwise normal ECG  No previous ECGs available  I reviewed and concur with this report. Electronically signed MA:[email or web address], M.D., ANDREW [phone]) on 11/15/2017 7:49:49 PM   CV: TTE 02/09/2018 ([place]): Summary: 1. Trivial tricuspid valve regurgitation. 2. Normal left ventricular cavity size and systolic function. 3. Right ventricular systolic pressure estimate = 25.1 mmHg. 4. Structurally normal heart Normal echocardiogram.   Past Medical History:  Diagnosis Date   Arachnoid cyst 05/27/2015   Asthma    as a child   Back disorder    Child with short stature 01/16/2021   Chronic cough 04/28/2022   Not currently, OTC Benadryl helps more than prescription antibiotics. Does see ENT.   Cleidocranial dysplasia    Difficulty hearing 01/16/2021   H/O bilateral inguinal hernia repair 04/28/2022   At about 6 months old   Hearing loss 01/16/2021   History of gastrostomy 04/28/2022    Couldn't eat first 2 years.    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    Past Surgical History:  Procedure Laterality Date   BACK SURGERY     MULTIPLE   CIRCUMCISION     HERNIA REPAIR     MYRINGOTOMY WITH TUBE PLACEMENT Bilateral 08/08/2018   Procedure: MYRINGOTOMY WITH BILATERAL TUBE PLACEMENT;  Surgeon: Izora Gala, MD;  Location: [place];  Service: ENT;  Laterality: Bilateral;   TONSILLECTOMY AND ADENOIDECTOMY     TYMPANOSTOMY TUBE PLACEMENT      MEDICATIONS: No current facility-administered medications for this encounter.    risperiDONE (RISPERDAL) 1 MG tablet    Myra Gianotti, PA-C Surgical Short Stay/Anesthesiology [facility] Phone [phone] [facility] Phone [phone] 05/19/2022 10:50 AM

## 2022-05-20 ENCOUNTER — Ambulatory Visit (HOSPITAL_COMMUNITY): Payer: BC Managed Care – PPO | Admitting: Vascular Surgery

## 2022-05-20 ENCOUNTER — Ambulatory Visit (HOSPITAL_COMMUNITY)
Admission: RE | Admit: 2022-05-20 | Discharge: 2022-05-20 | Disposition: A | Discharge status: Home / Self Care | Payer: BC Managed Care – PPO | Source: Ambulatory Visit | Attending: Otolaryngology | Admitting: Otolaryngology

## 2022-05-20 ENCOUNTER — Encounter (HOSPITAL_COMMUNITY): Payer: Self-pay | Admitting: Otolaryngology

## 2022-05-20 ENCOUNTER — Other Ambulatory Visit: Payer: Self-pay

## 2022-05-20 ENCOUNTER — Encounter (HOSPITAL_COMMUNITY)
Admission: RE | Disposition: A | Discharge status: Home / Self Care | Payer: Self-pay | Source: Ambulatory Visit | Attending: Otolaryngology

## 2022-05-20 DIAGNOSIS — H6123 Impacted cerumen, bilateral: Secondary | ICD-10-CM | POA: Diagnosis not present

## 2022-05-20 DIAGNOSIS — H6523 Chronic serous otitis media, bilateral: Secondary | ICD-10-CM | POA: Diagnosis not present

## 2022-05-20 DIAGNOSIS — J45909 Unspecified asthma, uncomplicated: Secondary | ICD-10-CM | POA: Diagnosis not present

## 2022-05-20 DIAGNOSIS — H6993 Unspecified Eustachian tube disorder, bilateral: Secondary | ICD-10-CM | POA: Insufficient documentation

## 2022-05-20 DIAGNOSIS — H906 Mixed conductive and sensorineural hearing loss, bilateral: Secondary | ICD-10-CM | POA: Insufficient documentation

## 2022-05-20 DIAGNOSIS — I498 Other specified cardiac arrhythmias: Secondary | ICD-10-CM | POA: Insufficient documentation

## 2022-05-20 DIAGNOSIS — G4733 Obstructive sleep apnea (adult) (pediatric): Secondary | ICD-10-CM | POA: Diagnosis not present

## 2022-05-20 HISTORY — PX: MYRINGOTOMY WITH TUBE PLACEMENT: SHX5663

## 2022-05-20 SURGERY — MYRINGOTOMY WITH TUBE PLACEMENT
Anesthesia: General | Site: Ear | Laterality: Bilateral

## 2022-05-20 MED ORDER — FENTANYL CITRATE (PF) 250 MCG/5ML IJ SOLN
INTRAMUSCULAR | Status: AC
  Filled 2022-05-20: qty 5

## 2022-05-20 MED ORDER — PROPOFOL 10 MG/ML IV BOLUS
INTRAVENOUS | Status: AC
  Filled 2022-05-20: qty 20

## 2022-05-20 MED ORDER — MIDAZOLAM HCL 2 MG/2ML IJ SOLN
INTRAMUSCULAR | Status: AC
  Filled 2022-05-20: qty 2

## 2022-05-20 MED ORDER — MIDAZOLAM HCL 2 MG/ML PO SYRP
0.5000 mg/kg | ORAL_SOLUTION | Freq: Once | ORAL | Status: AC
  Administered 2022-05-20: 22.8 mg via ORAL
  Filled 2022-05-20: qty 15

## 2022-05-20 MED ORDER — LACTATED RINGERS IV SOLN: INTRAVENOUS | Status: DC

## 2022-05-20 MED ORDER — CIPROFLOXACIN-DEXAMETHASONE 0.3-0.1 % OT SUSP
OTIC | Status: DC | PRN
  Administered 2022-05-20: 4 [drp] via OTIC

## 2022-05-20 MED ORDER — CHLORHEXIDINE GLUCONATE 0.12 % MT SOLN: 15.0000 mL | Freq: Once | OROMUCOSAL | Status: AC

## 2022-05-20 MED ORDER — ORAL CARE MOUTH RINSE
15.0000 mL | Freq: Once | OROMUCOSAL | Status: AC
  Administered 2022-05-20: 15 mL via OROMUCOSAL

## 2022-05-20 MED ORDER — CIPROFLOXACIN-DEXAMETHASONE 0.3-0.1 % OT SUSP
OTIC | Status: AC
  Filled 2022-05-20: qty 7.5

## 2022-05-20 SURGICAL SUPPLY — 19 items
BAG COUNTER SPONGE SURGICOUNT (BAG) ×1 IMPLANT
BAG SPNG CNTER NS LX DISP (BAG) ×1
BLADE MYRINGOTOMY 6 SPEAR HDL (BLADE) ×1 IMPLANT
CANISTER SUCT 3000ML PPV (MISCELLANEOUS) ×1 IMPLANT
CNTNR URN SCR LID CUP LEK RST (MISCELLANEOUS) IMPLANT
CONT SPEC 4OZ STRL OR WHT (MISCELLANEOUS)
COTTONBALL LRG STERILE PKG (GAUZE/BANDAGES/DRESSINGS) ×1 IMPLANT
COVER MAYO STAND STRL (DRAPES) ×1 IMPLANT
DRAPE HALF SHEET 40X57 (DRAPES) ×1 IMPLANT
KIT TURNOVER KIT B (KITS) ×1 IMPLANT
MARKER SKIN DUAL TIP RULER LAB (MISCELLANEOUS) IMPLANT
NDL PRECISIONGLIDE 27X1.5 (NEEDLE) IMPLANT
NEEDLE PRECISIONGLIDE 27X1.5 (NEEDLE) IMPLANT
PAD ARMBOARD 7.5X6 YLW CONV (MISCELLANEOUS) ×1 IMPLANT
SYR BULB EAR ULCER 3OZ GRN STR (SYRINGE) IMPLANT
TOWEL GREEN STERILE FF (TOWEL DISPOSABLE) ×1 IMPLANT
TUBE CONNECTING 12X1/4 (SUCTIONS) ×1 IMPLANT
TUBE EAR PAPARELLA TYPE 1 (OTOLOGIC RELATED) ×2 IMPLANT
TUBING EXTENTION W/L.L. (IV SETS) ×1 IMPLANT

## 2022-05-20 NOTE — Interval H&P Note (Signed)
History and Physical Interval Note:  05/20/2022 12:21 PM  Edwin Price  has presented today for surgery, with the diagnosis of Dysfunction of both eustachian tubes; Bilateral chronic serous otitis media; Mixed conductive and sensorineural hearing loss of both ears; Bilateral impacted cerumen.  The various methods of treatment have been discussed with the patient and family. After consideration of risks, benefits and other options for treatment, the patient has consented to  Procedure(s): MYRINGOTOMY WITH TUBE PLACEMENT (Bilateral) as a surgical intervention.  The patient's history has been reviewed, patient examined, no change in status, stable for surgery.  I have reviewed the patient's chart and labs.  Questions were answered to the patient's satisfaction.     Izora Gala

## 2022-05-20 NOTE — Anesthesia Postprocedure Evaluation (Signed)
Anesthesia Post Note  Patient: Edwin Price  Procedure(s) Performed: MYRINGOTOMY WITH TUBE PLACEMENT (Bilateral: Ear)     Patient location during evaluation: PACU Anesthesia Type: General Level of consciousness: awake and alert Pain management: pain level controlled Vital Signs Assessment: post-procedure vital signs reviewed and stable Respiratory status: spontaneous breathing, nonlabored ventilation and respiratory function stable Cardiovascular status: blood pressure returned to baseline and stable Postop Assessment: no apparent nausea or vomiting Anesthetic complications: no   No notable events documented.  Last Vitals:  Vitals:   05/20/22 1500 05/20/22 1515  BP: 102/61 100/70  Pulse: 69 66  Resp: 17 20  Temp:  36.4 C  SpO2: 100% 100%    Last Pain:  Vitals:   05/20/22 1029  TempSrc: Oral                 Belenda Cruise P Taylar Hartsough

## 2022-05-20 NOTE — Transfer of Care (Signed)
Immediate Anesthesia Transfer of Care Note  Patient: Edwin Price  Procedure(s) Performed: MYRINGOTOMY WITH TUBE PLACEMENT (Bilateral: Ear)  Patient Location: PACU  Anesthesia Type:General  Level of Consciousness: awake and drowsy  Airway & Oxygen Therapy: Patient Spontanous Breathing  Post-op Assessment: Report given to RN and Post -op Vital signs reviewed and stable  Post vital signs: Reviewed and stable  Last Vitals:  Vitals Value Taken Time  BP 112/86 05/20/22 1449  Temp    Pulse 93 05/20/22 1451  Resp 17 05/20/22 1451  SpO2 100 % 05/20/22 1451  Vitals shown include unvalidated device data.  Last Pain:  Vitals:   05/20/22 1029  TempSrc: Oral         Complications: No notable events documented.

## 2022-05-20 NOTE — Discharge Instructions (Signed)
Use the supplied eardrops, 3 drops in each ear, 3 times each day for 3 days. The first dose has already been given during surgery. Keep any remainders as you may need them in the future.

## 2022-05-20 NOTE — Anesthesia Procedure Notes (Signed)
Procedure Name: Intubation Date/Time: 05/20/2022 2:36 PM  Performed by: Dorthea Cove, CRNAPre-anesthesia Checklist: Patient identified, Emergency Drugs available, Suction available and Patient being monitored Patient Re-evaluated:Patient Re-evaluated prior to induction Oxygen Delivery Method: Circle system utilized Preoxygenation: Pre-oxygenation with 100% oxygen Induction Type: Inhalational induction Ventilation: Mask ventilation without difficulty Placement Confirmation: positive ETCO2 Dental Injury: Teeth and Oropharynx as per pre-operative assessment

## 2022-05-20 NOTE — Op Note (Signed)
05/20/2022  2:42 PM  PATIENT:  Edwin Price  21 y.o. male  PRE-OPERATIVE DIAGNOSIS:  Dysfunction of both eustachian tubes; Bilateral chronic serous otitis media; Mixed conductive and sensorineural hearing loss of both ears; Bilateral impacted cerumen  POST-OPERATIVE DIAGNOSIS:  Dysfunction of both eustachian tubes; Bilateral chronic serous otitis media; Mixed conductive and sensorineural hearing loss of both ears; Bilateral impacted cerumen  PROCEDURE:  Procedure(s): MYRINGOTOMY WITH TUBE PLACEMENT  SURGEON:  Surgeon(s): Izora Gala, MD  ANESTHESIA:   Mask inhalation  COUNTS:  Correct   DICTATION: The patient was taken to the operating room and placed on the operating table in the supine position. Following induction of mask inhalation anesthesia, the ears were inspected using the operating microscope and cleaned of cerumen. Anterior/inferior myringotomy incisions were created, no effusion was present . Modified T- tubes were placed without difficulty, Ciprodex drops were instilled into the ear canals. Cottonballs were placed bilaterally. The patient was then awakened from anesthesia and transferred to PACU in stable condition.   PATIENT DISPOSITION:  To PACU stable

## 2022-05-21 ENCOUNTER — Encounter (HOSPITAL_COMMUNITY): Payer: Self-pay | Admitting: Otolaryngology

## 2022-06-25 DIAGNOSIS — H906 Mixed conductive and sensorineural hearing loss, bilateral: Secondary | ICD-10-CM | POA: Diagnosis not present

## 2022-06-25 DIAGNOSIS — H6993 Unspecified Eustachian tube disorder, bilateral: Secondary | ICD-10-CM | POA: Diagnosis not present

## 2022-07-01 DIAGNOSIS — H906 Mixed conductive and sensorineural hearing loss, bilateral: Secondary | ICD-10-CM | POA: Diagnosis not present

## 2022-07-23 DIAGNOSIS — H6993 Unspecified Eustachian tube disorder, bilateral: Secondary | ICD-10-CM | POA: Diagnosis not present

## 2022-07-23 DIAGNOSIS — H906 Mixed conductive and sensorineural hearing loss, bilateral: Secondary | ICD-10-CM | POA: Diagnosis not present

## 2022-07-23 DIAGNOSIS — Q759 Congenital malformation of skull and face bones, unspecified: Secondary | ICD-10-CM | POA: Diagnosis not present

## 2023-04-20 DIAGNOSIS — H906 Mixed conductive and sensorineural hearing loss, bilateral: Secondary | ICD-10-CM | POA: Diagnosis not present

## 2023-04-20 DIAGNOSIS — Z9622 Myringotomy tube(s) status: Secondary | ICD-10-CM | POA: Diagnosis not present

## 2023-04-20 DIAGNOSIS — H6993 Unspecified Eustachian tube disorder, bilateral: Secondary | ICD-10-CM | POA: Diagnosis not present

## 2023-04-20 DIAGNOSIS — Q759 Congenital malformation of skull and face bones, unspecified: Secondary | ICD-10-CM | POA: Diagnosis not present

## 2023-05-12 DIAGNOSIS — H906 Mixed conductive and sensorineural hearing loss, bilateral: Secondary | ICD-10-CM | POA: Diagnosis not present

## 2023-05-24 ENCOUNTER — Telehealth: Payer: Self-pay

## 2023-05-24 NOTE — Telephone Encounter (Signed)
Called LVM to schedule annual CPE w/ PCP Jon Billings; advised cb number

## 2023-06-07 ENCOUNTER — Ambulatory Visit (INDEPENDENT_AMBULATORY_CARE_PROVIDER_SITE_OTHER): Payer: BC Managed Care – PPO | Admitting: Internal Medicine

## 2023-06-07 ENCOUNTER — Encounter: Payer: Self-pay | Admitting: Internal Medicine

## 2023-06-07 VITALS — BP 113/73 | HR 62 | Temp 98.7°F | Ht 60.0 in | Wt 96.8 lb

## 2023-06-07 DIAGNOSIS — L03115 Cellulitis of right lower limb: Secondary | ICD-10-CM

## 2023-06-07 DIAGNOSIS — G4733 Obstructive sleep apnea (adult) (pediatric): Secondary | ICD-10-CM

## 2023-06-07 DIAGNOSIS — Q763 Congenital scoliosis due to congenital bony malformation: Secondary | ICD-10-CM | POA: Diagnosis not present

## 2023-06-07 DIAGNOSIS — H6993 Unspecified Eustachian tube disorder, bilateral: Secondary | ICD-10-CM

## 2023-06-07 DIAGNOSIS — R748 Abnormal levels of other serum enzymes: Secondary | ICD-10-CM

## 2023-06-07 DIAGNOSIS — Z713 Dietary counseling and surveillance: Secondary | ICD-10-CM

## 2023-06-07 DIAGNOSIS — F819 Developmental disorder of scholastic skills, unspecified: Secondary | ICD-10-CM

## 2023-06-07 DIAGNOSIS — Z0000 Encounter for general adult medical examination without abnormal findings: Secondary | ICD-10-CM

## 2023-06-07 DIAGNOSIS — Q759 Congenital malformation of skull and face bones, unspecified: Secondary | ICD-10-CM

## 2023-06-07 DIAGNOSIS — Z0001 Encounter for general adult medical examination with abnormal findings: Secondary | ICD-10-CM

## 2023-06-07 DIAGNOSIS — F639 Impulse disorder, unspecified: Secondary | ICD-10-CM

## 2023-06-07 DIAGNOSIS — Q740 Other congenital malformations of upper limb(s), including shoulder girdle: Secondary | ICD-10-CM

## 2023-06-07 MED ORDER — CEPHALEXIN 500 MG PO CAPS: 500.0000 mg | ORAL_CAPSULE | Freq: Three times a day (TID) | ORAL | 0 refills | Status: AC

## 2023-06-07 NOTE — Progress Notes (Signed)
[facility]: [phone]   -- Annual Preventive Medical Office Visit --  Patient:  Edwin Price      Age: 22 y.o.       Sex:  male  Date:   06/07/2023 Patient Care Team: Lula Olszewski, MD as PCP - General (Internal Medicine) craniofacial team at [facility] as Surgeon Allyne Gee, Princella Ion, MD as Referring Physician (Pediatric Orthopedic Surgery) Serena Colonel, MD as Consulting Physician (Otolaryngology) Today's Healthcare Provider: Lula Olszewski, MD  ========================================= Chief Complaint  Patient presents with   Annual Exam    Pt here for annual exam - no concerns - pt fasting    Asthma    Purpose of Visit: Comprehensive preventive health assessment and personalized health maintenance planning.  This encounter was conducted as a Comprehensive Physical Exam (CPE) preventive care annual visit. The patient's medical history and problem list were reviewed to inform individualized preventive care recommendations.   Additiionally we treated for possible development of cellulitis in ant bites.    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 Despite a high pain tolerance and delayed reporting, no significant redness or  spreading was observed. We discussed applying antibiotic ointment and monitoring for signs of infection such as increased redness or spreading. Keflex will be prescribed if signs of cellulitis develop.  Post-Spinal Fusion Care   Following two spinal fusion surgeries due to rod breakage, he has shown a high pain tolerance and often does not report significant issues. Currently, there are no spine-related complaints. The importance of routine follow-up with an orthopedist at [facility] was discussed.  Hearing Loss   He uses hearing aids and has had a recent audiology evaluation with no current issues. Continued follow-up with ENT specialist Dr. Pollyann Kennedy at [facility] was discussed.  Developmental and Social Transition Planning   As he is nearing high school graduation and requires a structured routine, current efforts are focused on exploring post-graduation opportunities with adequate supervision and routine. Consulting with a Child psychotherapist and exploring programs at [employer] were discussed, emphasizing the importance of routine and structured activities for his well-being.  General Health Maintenance   During a routine wellness check, no acute issues were noted. Despite a history of low phosphate and a possible gallbladder issue, he maintains a healthy diet and active lifestyle. Blood work including vitamin D levels, phosphorus, A1c, thyroid function, and complete blood count was ordered, and yearly wellness exams were scheduled.  Follow-up   He will return for blood work at a later date due to current lab wait times.             Ordered    cephALEXin (KEFLEX) 500 MG capsule  3 times daily        06/07/23 0908    Ambulatory referral to Social Work        06/07/23 [phone]  CBC        06/07/23 0908    Differential        06/07/23 0908    Comprehensive metabolic panel        06/07/23 0908    TSH        06/07/23 0908    HgB A1c        06/07/23 0908    Phosphorus        06/07/23  0908    Vitamin D (25 hydroxy)        06/07/23 0908             Today's Health Maintenance Counseling and Anticipatory Guidance:  Eye exams:  We encourage patient to to complete eye exam every 1-2 years.   Dental health:  He reports he has been keeping up with his dental visits.  He intends to do so in the future.  We encourage regular tooth brushing/flossing. Due to craniofacial abnormal he has to have baby teeth pulled Sinus health:  he has craniofacial abnormal of sinuses but they appear healthy on exam Sleep Apnea screening: he has obstructive sleep apnea but cannot tolerate mask due to cognitive disorder Cardiovascular Risk Factor Reduction:   Advised patient of need for regular exercise and diet rich and fruits and vegetables and healthy fats to reduce risk of heart attack and stroke  Goals   None    Wt Readings from Last 3 Encounters:  06/07/23 96 lb 12.8 oz (43.9 kg)  05/20/22 100 lb (45.4 kg)  04/28/22 97 lb (44 kg)  Body mass index is 18.9 kg/m. Health maintenance and immunizations reviewed and he was encouraged to complete anything that is due: Immunization History  Administered Date(s) Administered   DTaP 01/02/2002, 03/03/2002, 03/03/2002, 11/08/2002, 08/12/2005   DTaP / Hep B / IPV 11/14/2001, 01/02/2002   HIB (PRP-OMP) 07/28/2002   HIB (PRP-T) 07/28/2002   Hepatitis A 08/12/2005, 04/04/2007   Hepatitis A, Ped/Adol-2 Dose 08/12/2005, 04/04/2007   Hepatitis B 09/27/2001, 11/14/2001, 01/02/2002, 03/03/2002   Hepatitis B, PED/ADOLESCENT 09/27/2001, 11/07/2001, 03/03/2002   IPV 11/14/2001, 01/02/2002, 07/28/2002, 08/12/2005   Influenza Split 05/11/2003, 04/04/2007   Influenza, Seasonal, Injecte, Preservative Fre 05/31/2006   MMR 07/28/2002, 08/12/2005, 03/31/2017   MenQuadfi_Meningococcal Groups ACYW Conjugate 01/31/2016   Meningococcal Conjugate 02/11/2021   PFIZER(Purple Top)SARS-COV-2 Vaccination 03/22/2020, 05/03/2020   Pneumococcal-Unspecified 11/07/2001,  11/07/2001, 01/02/2002, 01/02/2002, 03/03/2002, 03/03/2002, 07/28/2002, 07/28/2002   Tdap 01/31/2016   Varicella 11/08/2002, 08/12/2005   Health Maintenance Due  Topic Date Due   HPV VACCINES (1 - Male 3-dose series) Never done    Health Maintenance  Topic Date Due   HPV VACCINES (1 - Male 3-dose series) Never done   DTaP/Tdap/Td (7 - Td or Tdap) 01/30/2026   INFLUENZA VACCINE  Discontinued   COVID-19 Vaccine  Discontinued   Hepatitis C Screening  Discontinued   HIV Screening  Discontinued    Lifestyle / Social History Reviewed:  Social History   Tobacco Use   Smoking status: Never   Smokeless tobacco: Never  Vaping Use   Vaping status: Never Used  Substance Use Topics   Alcohol use: Never   Drug use: Never    My recommendation is total abstinence from all substances of abuse including smoke and 2nd hand smoke, alcohol, illicit drugs, smoking, inhalants, sugar, high risk behavior but none of these are concern(s) as his mother and stepfather are providing superb supportive loving supervised home environment. Social History   Substance and Sexual Activity  Sexual Activity  Not Currently  .      04/28/2022    8:28 AM  Depression screen PHQ 2/9  Decreased Interest 0  Down, Depressed, Hopeless 0  PHQ - 2 Score 0   Injury prevention: Discussed not texting and driving.  Today's Cancer Screening  Penile/Testicle/Scrotum cancer screening: we did not check, cognitive factors make this challenging but will consider in future as rapport improves. Thyroid cancer screening: today we palpating thyroid for nodules no cervical lymphadenopathy felt today Skin cancer screening-  no concerning lesions seen  Return to care in 1 year for next preventative visit.      Subjective  22 y.o. male presents today for a complete physical exam.  He reports consuming a general diet. Home exercise routine includes basic health measures. He generally feels well. He reports sleeping poorly. He does  have additional problems to discuss today. Ant bites all over leg, history high pain tolerance so not able to share concern(s). HPI HPI     Annual Exam    Additional comments: Pt here for annual exam - no concerns - pt fasting       Last edited by Samara Deist, CMA on 06/07/2023  8:38 AM.      AI-Extracted: Discussed the use of AI scribe software for clinical note transcription with the patient, who gave verbal consent to proceed.  History of Present Illness   The patient, a young individual with a history of high pain tolerance, presented for a routine check-up. The patient's mother reported that the patient had recently stepped in an ant pile, resulting in multiple ant bites on both legs. The bites were noted approximately 30 hours after the incident, and the patient did not report any discomfort. The patient's mother has been managing the bites with Epsom salt and monitoring for signs of infection.  The patient has a history of spinal fusion surgery, performed twice due to a broken rod in the back. Despite these surgeries, the patient reportedly exhibited minimal pain and was active soon after the procedures. The patient's mother expressed concern about the patient's high pain tolerance and potential lack of awareness of physical limitations.  The patient also has a history of hearing impairment and uses hearing aids. The patient recently received new hearing aids following an audiology hearing test. The patient's mother reported that the patient has sleep apnea but refuses to use a CPAP machine at night.  The patient's mother also discussed the patient's upcoming transition after graduation from school. The patient reportedly enjoys working and thrives on routine. The family is exploring various opportunities for structured work or educational programs that would be a good fit for the patient.  The patient's mother mentioned that the patient's diet is primarily home-cooked meals and  that the patient is physically active, frequently participating in activities such as jumping at a trampoline park, walking, and bike riding. The patient's mother also noted that the patient has a history of not losing baby teeth due to a part of the skeletal syndrome, which has required dental intervention.      ROSA comprehensive ROS was negative for any concerning symptoms. - but patient not able to report complaints effectively  Problem list overviews that were updated at today's visit: Problem  Elevated Alkaline Phosphatase Level   I attest that I have reviewed and confirmed the patients current medications to meet the medication reconciliation requirement - he never used risperdal so we are dcg it. Current Outpatient Medications on File Prior to Visit  Medication Sig   risperiDONE (RISPERDAL) 1 MG tablet Take 1 tablet (1 mg total) by mouth at bedtime. (Patient not taking: Reported on 06/07/2023)   No current facility-administered medications on file prior to visit.  There are no discontinued medications. Outpatient Medications Prior to Visit  Medication Sig   risperiDONE (RISPERDAL) 1 MG tablet Take 1 tablet (1 mg total) by mouth at bedtime. (Patient not taking: Reported on 06/07/2023)   No facility-administered medications prior to visit.  The following were reviewed and/or entered/updated into our electronic MEDICAL RECORD NUMBERPast Medical History:  Diagnosis Date   Arachnoid cyst 05/27/2015   Asthma    as a child, no longer an issue   Back disorder    Child with short stature 01/16/2021   Chronic cough 04/28/2022   Not currently, OTC Benadryl helps more than prescription antibiotics. Does see ENT.   Cleidocranial dysplasia    H/O bilateral inguinal hernia repair 04/28/2022   At about 45 months old   History of gastrostomy 04/28/2022   Couldn't eat first 2 years.  Gastrostomy has been removed per mother.   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   Past Surgical History:  Procedure Laterality Date   BACK SURGERY     MULTIPLE   CIRCUMCISION     DENTAL SURGERY     several with anesthesia   HERNIA REPAIR Bilateral    at 63 months old - no mesh   MYRINGOTOMY WITH TUBE PLACEMENT Bilateral 08/08/2018   Procedure: MYRINGOTOMY WITH BILATERAL TUBE PLACEMENT;  Surgeon: Serena Colonel, MD;  Location: [facility] OR;  Service: ENT;  Laterality: Bilateral;   MYRINGOTOMY WITH TUBE PLACEMENT Bilateral 05/20/2022   Procedure: MYRINGOTOMY WITH TUBE PLACEMENT;  Surgeon: Serena Colonel, MD;  Location: [facility] OR;  Service: ENT;  Laterality: Bilateral;   TONSILLECTOMY AND ADENOIDECTOMY     TYMPANOSTOMY TUBE PLACEMENT     Social History   Socioeconomic History   Marital status: Single    Spouse name: Not on file   Number of children: Not on file   Years of education: Not on file   Highest education level: Not on file  Occupational History   Not on file  Tobacco Use   Smoking status: Never   Smokeless tobacco: Never  Vaping Use   Vaping status: Never Used  Substance and Sexual Activity   Alcohol use: Never   Drug use: Never   Sexual activity: Not Currently  Other Topics Concern   Not on file  Social History Narrative   Quatez is a Kindergarten/1st grade home schooled student. He lives with his parents and siblings. He enjoys playing outside, digging, and watching YouTube Videos.    Social Determinants of Health   Financial Resource Strain: Low Risk  (06/12/2020)   Received [facility], [facility]   Overall Financial Resource Strain (CARDIA)    Difficulty of Paying Living Expenses: Not hard at all  Food Insecurity: No Food Insecurity (06/12/2020)   [facility], [facility]   Hunger Vital Sign    Worried About Running Out of Food in the Last Year: Never  true    Ran Out of Food in the Last Year: Never true  Transportation Needs: No Transportation Needs (06/12/2020)   Received from [facility], [facility]   [facility] - Transportation    Lack of Transportation (Medical): No    Lack of Transportation (Non-Medical): No  Physical Activity: Not on file  Stress: Not on file  Social Connections: Not on file  Intimate Partner Violence: Not on file   Family Status  Relation Name Status   Mother  Alive   Father  Alive   Sister 66 YO Alive   Sister 42 YO Alive   Brother 7 YO Alive   Brother 10 YO Alive   Brother 15 YO Alive   Brother TWIN Deceased   MGM  Alive   MGF  Alive   PGM  Alive   PGF  Alive  No partnership data on file   Family History  Problem Relation Age of Onset   Hearing loss Father    Other Father        Cleidocranial dysplasia   Hypertension Maternal Grandmother   No Known Allergies Patient Care Team: Lula Olszewski, MD as PCP - General (Internal Medicine) craniofacial team at [facility] as Surgeon Allyne Gee, Princella Ion, MD as Referring Physician (Pediatric Orthopedic Surgery) Serena Colonel, MD as Consulting Physician (Otolaryngology)      Objective  BP 113/73   Pulse 62   Temp 98.7 F (37.1 C)   Ht 5' (1.524 m)   Wt 96 lb 12.8 oz (43.9 kg)   SpO2 100%   BMI 18.90 kg/m    Physical Exam  Wt Readings from Last 3 Encounters:  06/07/23 96 lb 12.8 oz (43.9 kg)  05/20/22 100 lb (45.4 kg)  04/28/22 97 lb (44 kg)  Physical Exam   HEENT: Right nostril more open than left. CARDIOVASCULAR: Heart rate variable. ABDOMEN: No pain upon palpation near gallbladder area.     GEN: NAD, Resting Comfortably. HEENT: Tympanic membranes normal appearing bilaterally, Oropharynx clear, No Thyromegaly noted. No palpable lymphadenopathy or thyroid nodules. Craniofacial bone structure abnormal. CARDIOVASCULAR: S1 and S2  heart sounds have regular rate and rhythm with no murmurs appreciated. Variable heart rate PULMONARY:  Normal work of breathing. Clear to auscultation bilaterally with no crackles, wheezes, or rhonchi. Barrel-chested ABDOMEN: Soft, Nontender, Nondistended.  MSK: No edema, cyanosis, or clubbing noted. SKIN: Warm, dry, no lesions of concern observed. NEURO: CN2-12 grossly intact. Strength 5/5 in upper and lower extremities. Reflexes symmetric and intact bilaterally.  PSYCH: Normal affect and thought content, pleasant and cooperative. Last depression screening scores    04/28/2022    8:28 AM  PHQ 2/9 Scores  PHQ - 2 Score 0   Last fall risk screening    04/28/2022    8:28 AM  Fall Risk   Falls in the past year? 0  Number falls in past yr: 0  Injury with Fall? 0  Risk for fall due to : No Fall Risks  Follow up Falls evaluation completed   Last Audit-C alcohol use screening     No data to display         A score of 3 or more in women, and 4 or more in men indicates increased risk for alcohol abuse, EXCEPT if all of the points are from question 1  Last CBC Lab Results  Component Value Date   WBC 5.7 01/16/2021   HGB 14.8 01/16/2021   HCT 46.0 01/16/2021   MCV 88 01/16/2021  MCH 28.3 01/16/2021   RDW 12.0 01/16/2021   PLT 312 01/16/2021   Last metabolic panel Lab Results  Component Value Date   GLUCOSE 77 05/21/2021   NA 139 05/21/2021   K 3.7 05/21/2021   CL 103 05/21/2021   CO2 26 05/21/2021   BUN 11 05/21/2021   CREATININE 0.54 05/21/2021   GFR 144.20 05/21/2021   CALCIUM 9.4 05/21/2021   CALCIUM 9.6 05/21/2021   PHOS 3.3 (L) 05/21/2021   PROT 6.9 02/11/2021   ALBUMIN 4.9 02/11/2021   LABGLOB 1.8 01/16/2021   AGRATIO 2.9 (H) 01/16/2021   BILITOT 0.5 02/11/2021   ALKPHOS 210 (H) 02/11/2021   AST 16 02/11/2021   ALT 11 02/11/2021   ANIONGAP 12 06/14/2020   Last lipids No results found for: "CHOL", "HDL", "LDLCALC", "LDLDIRECT", "TRIG", "CHOLHDL" Last  hemoglobin A1c No results found for: "HGBA1C" Last thyroid functions Lab Results  Component Value Date   TSH 1.440 02/11/2021   T4TOTAL 7.7 02/11/2021   Last vitamin D Lab Results  Component Value Date   VD25OH 34.89 05/21/2021   Last vitamin B12 and Folate No results found for: "VITAMINB12", "FOLATE"      Lula Olszewski, MD  [facility] [place] [phone] (phone) [phone] (fax) [facility]

## 2023-06-07 NOTE — Patient Instructions (Signed)
Health Maintenance, Male Adopting a healthy lifestyle and getting preventive care are important in promoting health and wellness. Ask your health care provider about: The right schedule for you to have regular tests and exams. Things you can do on your own to prevent diseases and keep yourself healthy. What should I know about diet, weight, and exercise? Eat a healthy diet  Eat a diet that includes plenty of vegetables, fruits, low-fat dairy products, and lean protein. Do not eat a lot of foods that are high in solid fats, added sugars, or sodium. Maintain a healthy weight Body mass index (BMI) is a measurement that can be used to identify possible weight problems. It estimates body fat based on height and weight. Your health care provider can help determine your BMI and help you achieve or maintain a healthy weight. Get regular exercise Get regular exercise. This is one of the most important things you can do for your health. Most adults should: Exercise for at least 150 minutes each week. The exercise should increase your heart rate and make you sweat (moderate-intensity exercise). Do strengthening exercises at least twice a week. This is in addition to the moderate-intensity exercise. Spend less time sitting. Even light physical activity can be beneficial. Watch cholesterol and blood lipids Have your blood tested for lipids and cholesterol at 20 years of age, then have this test every 5 years. You may need to have your cholesterol levels checked more often if: Your lipid or cholesterol levels are high. You are older than 21 years of age. You are at high risk for heart disease. What should I know about cancer screening? Many types of cancers can be detected early and may often be prevented. Depending on your health history and family history, you may need to have cancer screening at various ages. This may include screening for: Colorectal cancer. Prostate cancer. Skin cancer. Lung  cancer. What should I know about heart disease, diabetes, and high blood pressure? Blood pressure and heart disease High blood pressure causes heart disease and increases the risk of stroke. This is more likely to develop in people who have high blood pressure readings or are overweight. Talk with your health care provider about your target blood pressure readings. Have your blood pressure checked: Every 3-5 years if you are 18-39 years of age. Every year if you are 40 years old or older. If you are between the ages of 65 and 75 and are a current or former smoker, ask your health care provider if you should have a one-time screening for abdominal aortic aneurysm (AAA). Diabetes Have regular diabetes screenings. This checks your fasting blood sugar level. Have the screening done: Once every three years after age 45 if you are at a normal weight and have a low risk for diabetes. More often and at a younger age if you are overweight or have a high risk for diabetes. What should I know about preventing infection? Hepatitis B If you have a higher risk for hepatitis B, you should be screened for this virus. Talk with your health care provider to find out if you are at risk for hepatitis B infection. Hepatitis C Blood testing is recommended for: Everyone born from 1945 through 1965. Anyone with known risk factors for hepatitis C. Sexually transmitted infections (STIs) You should be screened each year for STIs, including gonorrhea and chlamydia, if: You are sexually active and are younger than 21 years of age. You are older than 21 years of age and your   health care provider tells you that you are at risk for this type of infection. Your sexual activity has changed since you were last screened, and you are at increased risk for chlamydia or gonorrhea. Ask your health care provider if you are at risk. Ask your health care provider about whether you are at high risk for HIV. Your health care provider  may recommend a prescription medicine to help prevent HIV infection. If you choose to take medicine to prevent HIV, you should first get tested for HIV. You should then be tested every 3 months for as long as you are taking the medicine. Follow these instructions at home: Alcohol use Do not drink alcohol if your health care provider tells you not to drink. If you drink alcohol: Limit how much you have to 0-2 drinks a day. Know how much alcohol is in your drink. In the [place] one drink equals one 12 oz bottle of beer (355 mL), one 5 oz glass of wine (148 mL), or one 1 oz glass of hard liquor (44 mL). Lifestyle Do not use any products that contain nicotine or tobacco. These products include cigarettes, chewing tobacco, and vaping devices, such as e-cigarettes. If you need help quitting, ask your health care provider. Do not use street drugs. Do not share needles. Ask your health care provider for help if you need support or information about quitting drugs. General instructions Schedule regular health, dental, and eye exams. Stay current with your vaccines. Tell your health care provider if: You often feel depressed. You have ever been abused or do not feel safe at home. Summary Adopting a healthy lifestyle and getting preventive care are important in promoting health and wellness. Follow your health care provider's instructions about healthy diet, exercising, and getting tested or screened for diseases. Follow your health care provider's instructions on monitoring your cholesterol and blood pressure. This information is not intended to replace advice given to you by your health care provider. Make sure you discuss any questions you have with your health care provider. Document Revised: 11/25/2020 Document Reviewed: 11/25/2020 Elsevier Patient Education  2024 [employer]  

## 2023-06-21 ENCOUNTER — Other Ambulatory Visit (INDEPENDENT_AMBULATORY_CARE_PROVIDER_SITE_OTHER): Payer: BC Managed Care – PPO

## 2023-06-21 DIAGNOSIS — F639 Impulse disorder, unspecified: Secondary | ICD-10-CM

## 2023-06-21 DIAGNOSIS — Z713 Dietary counseling and surveillance: Secondary | ICD-10-CM

## 2023-06-21 DIAGNOSIS — L03115 Cellulitis of right lower limb: Secondary | ICD-10-CM

## 2023-06-21 DIAGNOSIS — H6993 Unspecified Eustachian tube disorder, bilateral: Secondary | ICD-10-CM

## 2023-06-21 DIAGNOSIS — G4733 Obstructive sleep apnea (adult) (pediatric): Secondary | ICD-10-CM | POA: Diagnosis not present

## 2023-06-21 DIAGNOSIS — Z0001 Encounter for general adult medical examination with abnormal findings: Secondary | ICD-10-CM | POA: Diagnosis not present

## 2023-06-21 DIAGNOSIS — Q763 Congenital scoliosis due to congenital bony malformation: Secondary | ICD-10-CM

## 2023-06-21 DIAGNOSIS — R748 Abnormal levels of other serum enzymes: Secondary | ICD-10-CM

## 2023-06-21 DIAGNOSIS — F819 Developmental disorder of scholastic skills, unspecified: Secondary | ICD-10-CM

## 2023-06-21 DIAGNOSIS — Q740 Other congenital malformations of upper limb(s), including shoulder girdle: Secondary | ICD-10-CM

## 2023-06-21 DIAGNOSIS — Z0000 Encounter for general adult medical examination without abnormal findings: Secondary | ICD-10-CM | POA: Diagnosis not present

## 2023-06-21 DIAGNOSIS — Q759 Congenital malformation of skull and face bones, unspecified: Secondary | ICD-10-CM

## 2023-06-21 LAB — COMPREHENSIVE METABOLIC PANEL
ALT: 11 U/L (ref 0–53)
AST: 18 U/L (ref 0–37)
Albumin: 4.4 g/dL (ref 3.5–5.2)
Alkaline Phosphatase: 81 U/L (ref 39–117)
BUN: 15 mg/dL (ref 6–23)
CO2: 26 meq/L (ref 19–32)
Calcium: 9.3 mg/dL (ref 8.4–10.5)
Chloride: 104 meq/L (ref 96–112)
Creatinine, Ser: 0.64 mg/dL (ref 0.40–1.50)
GFR: 135 mL/min (ref >60.00)
Glucose, Bld: 93 mg/dL (ref 70–99)
Potassium: 3.9 meq/L (ref 3.5–5.1)
Sodium: 137 meq/L (ref 135–145)
Total Bilirubin: 0.6 mg/dL (ref 0.2–1.2)
Total Protein: 6.7 g/dL (ref 6.0–8.3)

## 2023-06-21 LAB — VITAMIN D 25 HYDROXY (VIT D DEFICIENCY, FRACTURES): VITD: 32.66 ng/mL (ref 30.00–100.00)

## 2023-06-21 LAB — HEMOGLOBIN A1C: Hgb A1c MFr Bld: 5.4 % (ref 4.6–6.5)

## 2023-06-21 LAB — CBC WITH DIFFERENTIAL/PLATELET
Basophils Absolute: 0.1 10*3/uL (ref 0.0–0.1)
Basophils Relative: 0.9 % (ref 0.0–3.0)
Eosinophils Absolute: 0.2 10*3/uL (ref 0.0–0.7)
Eosinophils Relative: 3.5 % (ref 0.0–5.0)
HCT: 42.9 % (ref 39.0–52.0)
Hemoglobin: 14.7 g/dL (ref 13.0–17.0)
Lymphocytes Relative: 25.6 % (ref 12.0–46.0)
Lymphs Abs: 1.5 10*3/uL (ref 0.7–4.0)
MCHC: 34.2 g/dL (ref 30.0–36.0)
MCV: 87.7 fL (ref 78.0–100.0)
Monocytes Absolute: 0.4 10*3/uL (ref 0.1–1.0)
Monocytes Relative: 7.5 % (ref 3.0–12.0)
Neutro Abs: 3.6 10*3/uL (ref 1.4–7.7)
Neutrophils Relative %: 62.5 % (ref 43.0–77.0)
Platelets: 300 10*3/uL (ref 150.0–400.0)
RBC: 4.89 Mil/uL (ref 4.22–5.81)
RDW: 12.3 % (ref 11.5–15.5)
WBC: 5.7 10*3/uL (ref 4.0–10.5)

## 2023-06-21 LAB — PHOSPHORUS: Phosphorus: 3.3 mg/dL (ref 2.3–4.6)

## 2023-06-21 LAB — TSH: TSH: 0.6 u[IU]/mL (ref 0.35–5.50)

## 2023-06-21 NOTE — Addendum Note (Signed)
Addended by: Donzetta Starch on: 06/21/2023 08:38 AM   Modules accepted: Orders

## 2023-06-23 NOTE — Progress Notes (Signed)
Please call and notify mother/guardian, here's a possible script  "Great news! Dr.Marvin Maenza reviewed your Edwin Price's testing and everything looks good!"  "We recommend setting up your MyChart account for him to easily view his results and communicate with [place] securely regarding his care.  Having a MyChart account makes it easy to view your results, see them sooner, review them in detail, and even send [place] messages about them. Would you like help setting it up?"  "For any questions or to discuss further, please let us know. We appreciate you trusting [place] with your care!"

## 2024-02-07 DIAGNOSIS — Z974 Presence of external hearing-aid: Secondary | ICD-10-CM | POA: Diagnosis not present

## 2024-02-07 DIAGNOSIS — H938X2 Other specified disorders of left ear: Secondary | ICD-10-CM | POA: Diagnosis not present

## 2024-02-07 DIAGNOSIS — Z9622 Myringotomy tube(s) status: Secondary | ICD-10-CM | POA: Diagnosis not present

## 2024-02-07 DIAGNOSIS — H6123 Impacted cerumen, bilateral: Secondary | ICD-10-CM | POA: Diagnosis not present

## 2024-02-28 DIAGNOSIS — D485 Neoplasm of uncertain behavior of skin: Secondary | ICD-10-CM | POA: Diagnosis not present

## 2024-02-28 DIAGNOSIS — Z0001 Encounter for general adult medical examination with abnormal findings: Secondary | ICD-10-CM | POA: Diagnosis not present

## 2024-06-07 ENCOUNTER — Encounter: Payer: BC Managed Care – PPO | Admitting: Internal Medicine

## 2024-06-07 ENCOUNTER — Ambulatory Visit: Payer: Self-pay

## 2024-06-07 NOTE — Telephone Encounter (Signed)
 Called [facility] to advise them but no answer and no voicemail set up at the PCP office---unable to contact office about patient.

## 2024-06-07 NOTE — Telephone Encounter (Addendum)
 Please contact mother to reschedule this annual wellness appointment  Mother called and advised that the patient didn't sleep well last night and this morning has a fever and doesn't feel well She denies any other symptoms Mother just wanted to cancel his annual wellness physical today and was just going to let the patient rest for the day Mother states 101.2 around 1am-2am No medications were given at that time She states that her parents are with the patient at this time She states her father is a Teacher, Early Years/pre and he is still a Probation Officer She states that the patient is in a college environment and has been around some people who have been sick but patient was not showing any other signs or symptoms that she was concerned about. She also is very comfortable with her parents being with him today and she is advised for them to assess him temperature and she is given the precautions and advised if anything is worse to call us  back, take him to Urgent Care, or take him to the Emergency Room. She verbalized understanding.   FYI Only or Action Required?: Action required by provider: request for appointment, clinical question for provider, and update on patient condition.  Patient was last seen in primary care on 06/07/2023 by Jesus Bernardino MATSU, MD.  Called Nurse Triage reporting No chief complaint on file..  Symptoms began between 1-2am today.  Interventions attempted: Rest, hydration, or home remedies.  Symptoms are: unchanged.  Triage Disposition: Home Care  Patient/caregiver understands and will follow disposition?: Yes             Copied from CRM [id number]. Topic: Clinical - Red Word Triage >> Jun 07, 2024  7:49 AM Carlyon D wrote: Red Word that prompted transfer to Nurse Triage: pt mother Josette on the line Pt has a fever and appt today and mom would like to (reschedule) Reason for Disposition  [1] Fever AND [2] no signs of serious infection or localizing symptoms  (all other triage questions negative)  Answer Assessment - Initial Assessment Questions Mother called and advised that the patient didn't sleep well last night and this morning has a fever and doesn't feel well She denies any other symptoms Mother just wanted to cancel his annual wellness physical today and was just going to let the patient rest for the day Mother states 101.2 around 1am-2am No medications were given at that time She states that her parents are with the patient at this time She states her father is a Teacher, Early Years/pre and he is still a Probation Officer She states that the patient is in a college environment and has been around some people who have been sick but patient was not showing any other signs or symptoms that she was concerned about. She also is very comfortable with her parents being with him today and she is advised for them to assess him temperature and she is given the precautions and advised if anything is worse to call us  back, take him to Urgent Care, or take him to the Emergency Room. She verbalized understanding.  Protocols used: [facility]

## 2024-06-07 NOTE — Progress Notes (Deleted)
 [facility] [address] [place], [place] [address] Office:  [phone]  -- Annual Preventive Medical Office Visit --  Patient:  Edwin Price      Age: 23 y.o.       Sex:  male  Date:   06/07/2024 Patient Care Team: Jesus Bernardino KANDICE, MD as PCP - General (Internal Medicine) craniofacial team at [facility] as Surgeon Jarold, Lynwood Mae, MD as Referring Physician (Pediatric Orthopedic Surgery) Jesus Oliphant, MD as Consulting Physician (Otolaryngology) Today's Healthcare Provider: Bernardino KANDICE Jesus, MD  ========================================= Chief complaint: No chief complaint on file.  Purpose of Visit: Comprehensive preventive health assessment and personalized health maintenance planning.  This encounter was conducted as a Comprehensive Physical Exam (CPE) preventive care annual visit. The patient's medical history and problem list were reviewed to inform individualized preventive care recommendations.   No problem-specific medical treatment was provided during this visit.  Assessment & Plan  Assessment and Plan Assessment & Plan   Patient summary: Adult with severe intellectual disability and multiple congenital and acquired comorbidities including cleidocranial dysplasia, congenital scoliosis, sensorineural hearing loss, craniofacial syndrome, growth hormone deficiency, hydrocephalus, hypophosphatemia, and sleep-related hypoventilation. Surgical history notable for adenoidectomy, bilateral inguinal hernia repair, Nissen fundoplication, and gastrostomy. Caregiver (mother) present for comprehensive physical exam (CPE).  Top issues & follow-up flags:  Severe intellectual disability impacting communication and self-care Sensorineural hearing loss (bilateral) and speech disorder--assess hearing aid use/adherence, speech progress Sleep-related hypoventilation--review sleep quality, daytime symptoms, ventilatory support if any Growth hormone  deficiency--monitor growth parameters, therapy adherence, recent labs Hydrocephalus--any shunt in place? New neurological symptoms? Hypophosphatemia--review recent metabolic panel, supplementation Caries/dental health--last dental evaluation, current oral hygiene Chronic cough--clarify duration, triggers, recent imaging or workup Elevated alkaline phosphatase--review trend, underlying cause (bone vs liver vs other) Nocturnal enuresis--current management, impact on quality of life Oppositional defiant disorder/impulse control disorder--behavioral changes, current interventions History of gastrostomy--review feeding regimen, site care, complications History of Nissen fundoplication--GI symptoms, reflux control  Risks / red flags:  Signs of shunt malfunction (if present): headache, vomiting, altered consciousness Sleep-related hypoventilation: risk of hypoxia, cardiac complications Unexplained elevation in alkaline phosphatase--possible bone, liver, or metabolic pathology Chronic cough: aspiration risk, pneumonia, underlying pulmonary compromise Dental caries: risk for infection, pain, feeding difficulties Nutritional deficiencies (due to multiple factors: GHD, gastrostomy, hypophosphatemia)  Visit agenda (ranked priorities):  Medication and therapy review (growth hormone, supplements, behavioral meds, hearing aids) Neurological and respiratory status (hydrocephalus, sleep-related hypoventilation, chronic cough) Nutritional and metabolic assessment (feeding regimen, weight, labs--phosphate, alkaline phosphatase) Dental/oral health status Behavioral and developmental status update (ODD, impulse control, wandering, speech)  Suggested questions or data to obtain:  Any recent behavioral changes or new symptoms (neurological, respiratory, GI)? Adherence to prescribed therapies (meds, supplements, hearing aids, feeding regimen) Recent labs (CMP, phosphate, alkaline phosphatase, CBC, growth  markers) Recent imaging (brain, chest if indicated) Sleep history (snoring, apnea, daytime sleepiness) Dental visit history, current oral complaints GI symptoms (reflux, feeding intolerance, constipation) Any acute events since last visit (hospitalizations, infections) Caregiver concerns or goals for the visit  Draft assessment & plan outline:  Subjective:  Review of systems with caregiver, focusing on neurological, respiratory, GI, sleep, behavioral domains  Objective:  Vitals, growth parameters, physical exam (neuro, respiratory, dental, GI, musculoskeletal) Review available labs and imaging  Assessment:  Severe intellectual disability with multiple congenital and acquired comorbidities (see above) Multiple active medical and behavioral issues requiring ongoing multidisciplinary management  Plan:  Continue/adjust medications and therapies as indicated Order/update relevant labs (CMP,  phosphate, alkaline phosphatase, CBC, growth markers) Refer to/review with dental, neurology, pulmonology, endocrinology as appropriate Address caregiver concerns, reinforce care plan and monitoring Schedule follow-up per clinical status and needs  Let me know if you need a more detailed assessment or specific template for documentation. No diagnosis found.   Reviewed/updated/encouraged completion: Immunization History  Administered Date(s) Administered   DTaP 01/02/2002, 03/03/2002, 03/03/2002, 11/08/2002, 08/12/2005   DTaP / Hep B / IPV 11/14/2001, 01/02/2002   HIB (PRP-OMP) 07/28/2002   HIB (PRP-T) 07/28/2002   Hepatitis A 08/12/2005, 04/04/2007   Hepatitis A, Ped/Adol-2 Dose 08/12/2005, 04/04/2007   Hepatitis B 09/27/2001, 11/14/2001, 01/02/2002, 03/03/2002   Hepatitis B, PED/ADOLESCENT 09/27/2001, 11/07/2001, 03/03/2002   IPV 11/14/2001, 01/02/2002, 07/28/2002, 08/12/2005   Influenza Split 05/11/2003, 04/04/2007   Influenza, Seasonal, Injecte, Preservative Fre 05/31/2006   MMR  07/28/2002, 08/12/2005, 03/31/2017   MenQuadfi_Meningococcal Groups ACYW Conjugate 01/31/2016   Meningococcal Conjugate 02/11/2021   PFIZER(Purple Top)SARS-COV-2 Vaccination 03/22/2020, 05/03/2020   Pneumococcal-Unspecified 11/07/2001, 11/07/2001, 01/02/2002, 01/02/2002, 03/03/2002, 03/03/2002, 07/28/2002, 07/28/2002   Tdap 01/31/2016   Varicella 11/08/2002, 08/12/2005   Health Maintenance Due  Topic Date Due   HPV VACCINES (1 - Male 3-dose series) Never done   Meningococcal B Vaccine (1 of 2 - Standard) Never done   Health Maintenance  Topic Date Due   HPV VACCINES (1 - Male 3-dose series) Never done   Meningococcal B Vaccine (1 of 2 - Standard) Never done   DTaP/Tdap/Td (7 - Td or Tdap) 01/30/2026   Hepatitis B Vaccines 19-59 Average Risk  Completed   Pneumococcal Vaccine  Aged Out   Influenza Vaccine  Discontinued   COVID-19 Vaccine  Discontinued   Hepatitis C Screening  Discontinued   HIV Screening  Discontinued    Reviewed the following verbally with patient and provided AVS materials:   HEALTH MAINTENANCE COUNSELING AND ANTICIPATORY GUIDANCE    Preventive Measure Recommendation  Eye Exams Every 1-2 years  Dental Care Cleanings every 6 months or more, brush/floss 3x daily  Sinus Care Saline spray rinses daily  Sleep 8 hours nightly, good sleep hygiene, e-monitoring if any daytime drowsiness  Diet Fruits/vegetables/fiber/healthy fats, balance and moderation  Exercise 150 minutes weekly  Risk Behaviors Discouraged any/all high risk behaviors    CANCER SCREENING SHARED DECISION MAKING    Penile/Testicle/Scrotum Encouraged self-monitoring and reporting of genital abnormalities. Patient reports none.  Thyroid  Thyroid  was palpated for nodules today. {ThyroidCancerScreening:33835}  Prostate Individualized risks/benefits/costs discussed {ProstateCancerScreening:33834} No results found for: PSA  Colon HM Colonoscopy   This patient has no relevant Health Maintenance  data.   {ColonCancerScreening:33833}  Lung Current guidelines recommend individuals aged 76 to 44 who currently smoke or formerly smoked and have a >= 20 pack-year smoking history should undergo annual screening with low-dose computed tomography (LDCT). Tobacco Use: Low Risk  (02/07/2024)   Received from [facility]   Patient History    Smoking Tobacco Use: Never    Smokeless Tobacco Use: Never    Passive Exposure: Not on file   Social History   Tobacco Use  Smoking Status Never  Smokeless Tobacco Never    Skin Advised regular sunscreen use. Patient denies worrisome, changing, or new skin lesions. Offered to include images in chart for surveillance. Showed patient these pictures of melanomas for reference to educate for self-monitoring.  Other Cancers Discussed lack of screening guidelines and insurance coverage for other cancer types.    Discussed the use of AI scribe software for clinical note transcription with the patient, who gave  verbal consent to proceed.  History of Present Illness     ROS A comprehensive ROS was negative for any concerning symptoms.   Completed medication reconciliation: No current outpatient medications on file prior to visit.   No current facility-administered medications on file prior to visit.  There are no discontinued medications.The following were reviewed and/or entered/updated into our electronic MEDICAL RECORD NUMBERPast Medical History:  Diagnosis Date   Arachnoid cyst 05/27/2015   Asthma    as a child, no longer an issue   Back disorder    Child with short stature 01/16/2021   Chronic cough 04/28/2022   Not currently, OTC Benadryl helps more than prescription antibiotics. Does see ENT.   Cleidocranial dysplasia    H/O bilateral inguinal hernia repair 04/28/2022   At about 43 months old   History of gastrostomy 04/28/2022   Couldn't eat first 2 years.  Gastrostomy has been removed per mother.   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   Past Surgical History:  Procedure Laterality Date   BACK SURGERY     MULTIPLE   CIRCUMCISION     DENTAL SURGERY     several with anesthesia   HERNIA REPAIR Bilateral    at 98 months old - no mesh   MYRINGOTOMY WITH TUBE PLACEMENT Bilateral 08/08/2018   Procedure: MYRINGOTOMY WITH BILATERAL TUBE PLACEMENT;  Surgeon: Jesus Oliphant, MD;  Location: [facility] OR;  Service: ENT;  Laterality: Bilateral;   MYRINGOTOMY WITH TUBE PLACEMENT Bilateral 05/20/2022   Procedure: MYRINGOTOMY WITH TUBE PLACEMENT;  Surgeon: Jesus Oliphant, MD;  Location: [facility] OR;  Service: ENT;  Laterality: Bilateral;   TONSILLECTOMY AND ADENOIDECTOMY     TYMPANOSTOMY TUBE PLACEMENT     Social History   Socioeconomic History   Marital status: Single    Spouse name: Not on file   Number of children: Not on file   Years of education: Not on file   Highest education level: Not on file  Occupational History   Not on file  Tobacco Use   Smoking status: Never   Smokeless tobacco: Never  Vaping Use   Vaping status: Never Used  Substance and Sexual Activity   Alcohol use: Never   Drug use: Never   Sexual activity: Not Currently  Other Topics Concern   Not on file  Social History Narrative   Kate is a Kindergarten/1st grade home schooled student. He lives with his parents and siblings. He enjoys playing outside, digging, and watching YouTube Videos.    Social Drivers of Corporate Investment Banker Strain: Low Risk (06/12/2020)   Received from [facility]   Overall Financial Resource Strain (CARDIA)    Difficulty of Paying Living Expenses: Not hard at all  Food Insecurity: No Food Insecurity (06/12/2020)   Received from [facility]   Hunger Vital Sign    Within the past 12 months, you worried that your food would run out before you got the money to buy more.: Never true    Within the past 12 months, the food you bought just didn't last and you didn't have money to get more.: Never  true  Transportation Needs: No Transportation Needs (06/12/2020)   Received from [facility] - Transportation    Lack of Transportation (Medical): No    Lack of Transportation (Non-Medical): No  Physical Activity: Not on file  Stress: Not on file  Social Connections: Not on file  Intimate Partner Violence: Not on file       No data to display         Family History  Problem Relation Age of Onset   Hearing loss Father    Other Father        Cleidocranial dysplasia   Hypertension Maternal Grandmother   No Known Allergies Social History   Substance and Sexual Activity  Sexual Activity Not Currently  @    04/28/2022    8:28 AM  Depression screen PHQ 2/9  Decreased Interest 0  Down, Depressed, Hopeless 0  PHQ - 2 Score 0      04/28/2022    8:28 AM  Fall Risk   Falls in the past year? 0  Number falls in past yr: 0  Injury with Fall? 0  Risk for fall due to : No Fall Risks  Follow up Falls evaluation completed      Data saved with a previous flowsheet row definition     There were no vitals taken for this visit. BP Readings from Last 3 Encounters:  06/07/23 113/73  05/20/22 100/70  04/28/22 136/72   Wt Readings from Last 10 Encounters:  06/07/23 96 lb 12.8 oz (43.9 kg)  05/20/22 100 lb (45.4 kg)  04/28/22 97 lb (44 kg)  05/21/21 99 lb 6.4 oz (45.1 kg) (<1%, Z= -3.44)*  01/16/21 97 lb 6.4 oz (44.2 kg) (<1%, Z= -3.61)*  08/08/18 70 lb 4 oz (31.9 kg) (<1%, Z= -6.10)*  05/27/15 53 lb (24 kg) (<1%, Z= -4.83)*  04/08/12 41 lb 6.4 oz (18.8 kg) (<1%, Z= -4.63)*  07/14/11 40 lb 12.6 oz (18.5 kg) (<1%, Z= -4.28)*   * Growth percentiles are based on CDC (Boys, 2-20 Years) data.  Physical  Exam  Physical Exam   GEN: No acute distress, resting comfortably. HEENT: Tympanic membranes normal appearing bilaterally, oropharynx clear, no thyromegaly noted, no palpable lymphadenopathy or thyroid  nodules. CARDIOVASCULAR: S1 and S2 heart sounds with regular rate and rhythm, no murmurs appreciated. PULMONARY: Normal work of breathing, clear to auscultation bilaterally, no crackles, wheezes, or rhonchi. ABDOMEN: Soft, nontender, nondistended. MSK: No edema, cyanosis, or clubbing noted. SKIN: Warm, dry, no lesions of concern observed. NEUROLOGICAL: Cranial nerves II-XII grossly intact, strength 5/5 in upper and lower extremities, reflexes symmetric and intact bilaterally. PSYCH: Normal affect and thought content, pleasant and cooperative.  {Insert previous labs (optional):23779} {See past labs  Heme  Chem  Endocrine  Serology  Results Review (optional):1}   ======================================  IMPORTANT HEALTH REMINDERS: Report any new or changing skin lesions promptly Maintain recommended screening schedules Discuss any new family history of cancer at future visits Follow up on any new symptoms that persist more than two weeks      Notes:  This document was synthesized by artificial intelligence (Abridge) using HIPAA-compliant recording of the clinical interaction;   We discussed the use of AI scribe software for clinical note transcription with the patient, who gave verbal consent to proceed.    This encounter employed state-of-the-art, real-time, collaborative documentation. The patient was empowered to actively review and assist in updating their electronic medical record on a shared monitor, ensuring transparency and improving accuracy.  Prior to and at the beginning of Comprehensive Physical Exam (CPE) preventive care annual visit appointment types  we clarify to patients Our goal today is to focus on your preventive or annual Comprehensive Physical Exam (CPE)  preventive care annual visit, which typically covers routine screenings and overall health maintenance. However, if you share any new or concerning symptoms--such as dizziness, passing out, severe pain, or anything else that may point to a more serious issue--we are both legally and ethically required to evaluate it. We cannot simply overlook or ignore such concerns, even if you later decide you don't want to discuss them, because it could jeopardize your health.  If addressing a new concern takes us  beyond the scope of the preventive visit, we may need to bill separately for that portion of care. We understand financial considerations are important, and we're happy to discuss your options if something new comes up. However, we want to be clear that once you mention a potentially serious issue, we must investigate it; we can't ethically or legally exclude that from our records or our evaluation. Please let us  know all of your questions or worries. Together, we can decide how best to manage them and how to minimize any unexpected costs, but we want to keep you safe above all else.   This disclosure is mandated by professional ethics and legal obligations, as healthcare providers must address any substantial health concerns raised during any patient interaction and a comprehensive ROS is required by insurance companies for billing preventive-care visit type.   This disclosure ultimately discourages patients financially from reporting significant health issues.   Medical Screening Exam A medical screening exam (MSE) helps to determine whether you need immediate medical treatment relating to any number of symptoms you are having. This type of exam may be done in an emergency department, an urgent care setting, or your health care provider's office. Depending on your symptoms and severity, you may need additional tests or medical therapy. It is important to note that an MSE does not necessarily mean that you will  need or receive further medical testing or interventions if your symptoms are not deemed to be medically urgent (emergent). Tell a health care provider about: Any allergies you have. All medicines you are taking, including vitamins, herbs, eye drops, creams, and over-the-counter medicines. Any problems you or family members have had with anesthetic medicines. Any bleeding problems you have. Any surgeries you have had. Any medical conditions you have. Whether you are pregnant or may be pregnant. What happens during the test? During the exam, a health care provider does a short, often focused, physical exam and asks about your medical history to assess: Your current symptoms. Your overall health. Your need for possible further medical intervention. What can I expect after the test? If you have a regular health care provider, make an appointment for a follow-up visit with him or her. If you do not have a regular health care provider, ask about resources in your community. Your medical screening exam may determine that: You do not need emergency treatment at this time. You need treatment right away. You need to be transferred to another medical center. This may happen if you need an emergent specialist or consultant that is not available at the medical center you are at. You need to have more tests. A medical specialist may be consulted if needed. Get help right away if: Your condition gets worse. You develop new or troubling symptoms before you see your health care  provider. These symptoms may represent a serious problem that is an emergency. Do not wait to see if the symptoms will go away. Get medical help right away. Call your local emergency services (911 in the [place] Do not drive yourself to the hospital. Summary A medical screening exam helps to determine whether you need medical treatment right away. This type of exam may be done in an emergency department, an urgent care setting, or  your health care provider's office. During the exam, a health care provider does a short physical exam and asks about your current symptoms and overall health. Depending on the exam, more tests or therapies may be ordered. However, an MSE does not necessarily mean that you will have further medical testing if your symptoms are not deemed to be urgent. If you need further care that is not offered at your current medical center, you may need to be transferred to another facility. This information is not intended to replace advice given to you by your health care provider. Make sure you discuss any questions you have with your health care provider. Document Revised: 03/19/2021 Document Reviewed: 11/14/2020 Elsevier Patient Education  2024 Elsevier Inc.   Health Maintenance, Male Adopting a healthy lifestyle and getting preventive care are important in promoting health and wellness. Ask your health care provider about: The right schedule for you to have regular tests and exams. Things you can do on your own to prevent diseases and keep yourself healthy. What should I know about diet, weight, and exercise? Eat a healthy diet  Eat a diet that includes plenty of vegetables, fruits, low-fat dairy products, and lean protein. Do not eat a lot of foods that are high in solid fats, added sugars, or sodium. Maintain a healthy weight Body mass index (BMI) is a measurement that can be used to identify possible weight problems. It estimates body fat based on height and weight. Your health care provider can help determine your BMI and help you achieve or maintain a healthy weight. Get regular exercise Get regular exercise. This is one of the most important things you can do for your health. Most adults should: Exercise for at least 150 minutes each week. The exercise should increase your heart rate and make you sweat (moderate-intensity exercise). Do strengthening exercises at least twice a week. This is in  addition to the moderate-intensity exercise. Spend less time sitting. Even light physical activity can be beneficial. Watch cholesterol and blood lipids Have your blood tested for lipids and cholesterol at 22 years of age, then have this test every 5 years. You may need to have your cholesterol levels checked more often if: Your lipid or cholesterol levels are high. You are older than 22 years of age. You are at high risk for heart disease. What should I know about cancer screening? Many types of cancers can be detected early and may often be prevented. Depending on your health history and family history, you may need to have cancer screening at various ages. This may include screening for: Colorectal cancer. Prostate cancer. Skin cancer. Lung cancer. What should I know about heart disease, diabetes, and high blood pressure? Blood pressure and heart disease High blood pressure causes heart disease and increases the risk of stroke. This is more likely to develop in people who have high blood pressure readings or are overweight. Talk with your health care provider about your target blood pressure readings. Have your blood pressure checked: Every 3-5 years if you are 18-39 years of  age. Every year if you are 33 years old or older. If you are between the ages of 84 and 70 and are a current or former smoker, ask your health care provider if you should have a one-time screening for abdominal aortic aneurysm (AAA). Diabetes Have regular diabetes screenings. This checks your fasting blood sugar level. Have the screening done: Once every three years after age 74 if you are at a normal weight and have a low risk for diabetes. More often and at a younger age if you are overweight or have a high risk for diabetes. What should I know about preventing infection? Hepatitis B If you have a higher risk for hepatitis B, you should be screened for this virus. Talk with your health care provider to find out  if you are at risk for hepatitis B infection. Hepatitis C Blood testing is recommended for: Everyone born from [birth year] through 1964. Anyone with known risk factors for hepatitis C. Sexually transmitted infections (STIs) You should be screened each year for STIs, including gonorrhea and chlamydia, if: You are sexually active and are younger than 22 years of age. You are older than 22 years of age and your health care provider tells you that you are at risk for this type of infection. Your sexual activity has changed since you were last screened, and you are at increased risk for chlamydia or gonorrhea. Ask your health care provider if you are at risk. Ask your health care provider about whether you are at high risk for HIV. Your health care provider may recommend a prescription medicine to help prevent HIV infection. If you choose to take medicine to prevent HIV, you should first get tested for HIV. You should then be tested every 3 months for as long as you are taking the medicine. Follow these instructions at home: Alcohol use Do not drink alcohol if your health care provider tells you not to drink. If you drink alcohol: Limit how much you have to 0-2 drinks a day. Know how much alcohol is in your drink. In the [place] one drink equals one 12 oz bottle of beer (355 mL), one 5 oz glass of wine (148 mL), or one 1 oz glass of hard liquor (44 mL). Lifestyle Do not use any products that contain nicotine or tobacco. These products include cigarettes, chewing tobacco, and vaping devices, such as e-cigarettes. If you need help quitting, ask your health care provider. Do not use street drugs. Do not share needles. Ask your health care provider for help if you need support or information about quitting drugs. General instructions Schedule regular health, dental, and eye exams. Stay current with your vaccines. Tell your health care provider if: You often feel depressed. You have ever been abused or do  not feel safe at home. Summary Adopting a healthy lifestyle and getting preventive care are important in promoting health and wellness. Follow your health care provider's instructions about healthy diet, exercising, and getting tested or screened for diseases. Follow your health care provider's instructions on monitoring your cholesterol and blood pressure. This information is not intended to replace advice given to you by your health care provider. Make sure you discuss any questions you have with your health care provider. Document Revised: 11/25/2020 Document Reviewed: 11/25/2020 [employer]vier Patient Education  2024 [employer].

## 2024-06-07 NOTE — Telephone Encounter (Signed)
 Noted
